# Patient Record
Sex: Female | Born: 1985 | Race: Black or African American | Hispanic: No | Smoking: Never smoker
Health system: Southern US, Community
[De-identification: ages and names within clinical notes are randomized; demographics above are authoritative.]

## PROBLEM LIST (undated history)

## (undated) ENCOUNTER — Emergency Department (HOSPITAL_COMMUNITY): Disposition: A | Discharge status: Home / Self Care | Payer: 59

## (undated) DIAGNOSIS — R0602 Shortness of breath: Secondary | ICD-10-CM

## (undated) DIAGNOSIS — I517 Cardiomegaly: Secondary | ICD-10-CM

## (undated) HISTORY — PX: NO PAST SURGERIES: SHX2092

---

## 2006-09-10 ENCOUNTER — Inpatient Hospital Stay (HOSPITAL_COMMUNITY): Admission: AD | Admit: 2006-09-10 | Discharge: 2006-09-10 | Payer: Self-pay | Admitting: Obstetrics & Gynecology

## 2006-09-12 ENCOUNTER — Inpatient Hospital Stay (HOSPITAL_COMMUNITY): Admission: AD | Admit: 2006-09-12 | Discharge: 2006-09-12 | Payer: Self-pay | Admitting: Obstetrics and Gynecology

## 2006-10-20 ENCOUNTER — Inpatient Hospital Stay (HOSPITAL_COMMUNITY): Admission: AD | Admit: 2006-10-20 | Discharge: 2006-10-20 | Payer: Self-pay | Admitting: Obstetrics & Gynecology

## 2006-11-10 ENCOUNTER — Inpatient Hospital Stay (HOSPITAL_COMMUNITY): Admission: AD | Admit: 2006-11-10 | Discharge: 2006-11-10 | Payer: Self-pay | Admitting: Gynecology

## 2006-11-10 ENCOUNTER — Inpatient Hospital Stay (HOSPITAL_COMMUNITY): Admission: AD | Admit: 2006-11-10 | Discharge: 2006-11-10 | Payer: Self-pay | Admitting: Obstetrics & Gynecology

## 2006-11-27 ENCOUNTER — Ambulatory Visit (HOSPITAL_COMMUNITY): Admission: RE | Admit: 2006-11-27 | Discharge: 2006-11-27 | Payer: Self-pay | Admitting: Obstetrics

## 2006-12-10 ENCOUNTER — Inpatient Hospital Stay (HOSPITAL_COMMUNITY): Admission: AD | Admit: 2006-12-10 | Discharge: 2006-12-10 | Payer: Self-pay | Admitting: Obstetrics & Gynecology

## 2007-02-06 ENCOUNTER — Inpatient Hospital Stay (HOSPITAL_COMMUNITY): Admission: AD | Admit: 2007-02-06 | Discharge: 2007-02-06 | Payer: Self-pay | Admitting: Obstetrics & Gynecology

## 2007-02-22 ENCOUNTER — Inpatient Hospital Stay (HOSPITAL_COMMUNITY): Admission: AD | Admit: 2007-02-22 | Discharge: 2007-02-22 | Payer: Self-pay | Admitting: Obstetrics & Gynecology

## 2007-03-23 ENCOUNTER — Inpatient Hospital Stay (HOSPITAL_COMMUNITY): Admission: AD | Admit: 2007-03-23 | Discharge: 2007-03-23 | Payer: Self-pay | Admitting: Obstetrics & Gynecology

## 2007-04-18 ENCOUNTER — Inpatient Hospital Stay (HOSPITAL_COMMUNITY): Admission: AD | Admit: 2007-04-18 | Discharge: 2007-04-18 | Payer: Self-pay | Admitting: Obstetrics

## 2007-04-21 ENCOUNTER — Inpatient Hospital Stay (HOSPITAL_COMMUNITY): Admission: AD | Admit: 2007-04-21 | Discharge: 2007-04-22 | Payer: Self-pay | Admitting: Obstetrics & Gynecology

## 2007-04-23 ENCOUNTER — Inpatient Hospital Stay (HOSPITAL_COMMUNITY): Admission: AD | Admit: 2007-04-23 | Discharge: 2007-04-25 | Payer: Self-pay | Admitting: Obstetrics & Gynecology

## 2007-04-23 ENCOUNTER — Encounter: Payer: Self-pay | Admitting: Obstetrics & Gynecology

## 2008-10-18 ENCOUNTER — Emergency Department (HOSPITAL_COMMUNITY): Admission: EM | Admit: 2008-10-18 | Discharge: 2008-10-18 | Payer: Self-pay | Admitting: Emergency Medicine

## 2009-07-23 ENCOUNTER — Inpatient Hospital Stay (HOSPITAL_COMMUNITY): Admission: AD | Admit: 2009-07-23 | Discharge: 2009-07-23 | Payer: Self-pay | Admitting: Obstetrics & Gynecology

## 2009-08-15 ENCOUNTER — Inpatient Hospital Stay (HOSPITAL_COMMUNITY): Admission: AD | Admit: 2009-08-15 | Discharge: 2009-08-15 | Payer: Self-pay | Admitting: Obstetrics & Gynecology

## 2009-11-11 ENCOUNTER — Inpatient Hospital Stay (HOSPITAL_COMMUNITY): Admission: AD | Admit: 2009-11-11 | Discharge: 2009-11-11 | Payer: Self-pay | Admitting: Obstetrics and Gynecology

## 2010-01-05 ENCOUNTER — Inpatient Hospital Stay (HOSPITAL_COMMUNITY): Admission: AD | Admit: 2010-01-05 | Discharge: 2010-01-05 | Payer: Self-pay | Admitting: Obstetrics and Gynecology

## 2010-02-17 ENCOUNTER — Inpatient Hospital Stay (HOSPITAL_COMMUNITY): Admission: AD | Admit: 2010-02-17 | Discharge: 2010-02-17 | Payer: Self-pay | Admitting: Obstetrics and Gynecology

## 2010-02-17 ENCOUNTER — Ambulatory Visit: Payer: Self-pay | Admitting: Obstetrics and Gynecology

## 2010-02-21 ENCOUNTER — Inpatient Hospital Stay (HOSPITAL_COMMUNITY): Admission: AD | Admit: 2010-02-21 | Discharge: 2010-02-21 | Payer: Self-pay | Admitting: Obstetrics and Gynecology

## 2010-03-04 ENCOUNTER — Observation Stay (HOSPITAL_COMMUNITY): Admission: AD | Admit: 2010-03-04 | Discharge: 2010-03-05 | Payer: Self-pay | Admitting: Obstetrics and Gynecology

## 2010-03-09 ENCOUNTER — Inpatient Hospital Stay (HOSPITAL_COMMUNITY): Admission: AD | Admit: 2010-03-09 | Discharge: 2010-03-10 | Payer: Self-pay | Admitting: Obstetrics and Gynecology

## 2010-03-11 ENCOUNTER — Inpatient Hospital Stay (HOSPITAL_COMMUNITY): Admission: AD | Admit: 2010-03-11 | Discharge: 2010-03-13 | Payer: Self-pay | Admitting: Obstetrics and Gynecology

## 2010-05-10 ENCOUNTER — Emergency Department (HOSPITAL_COMMUNITY): Admission: EM | Admit: 2010-05-10 | Discharge: 2010-05-11 | Payer: Self-pay | Admitting: Emergency Medicine

## 2010-07-21 ENCOUNTER — Inpatient Hospital Stay (HOSPITAL_COMMUNITY): Admission: AD | Admit: 2010-07-21 | Discharge: 2010-07-21 | Payer: Self-pay | Admitting: Obstetrics and Gynecology

## 2010-07-21 ENCOUNTER — Ambulatory Visit: Payer: Self-pay | Admitting: Gynecology

## 2010-08-11 ENCOUNTER — Inpatient Hospital Stay (HOSPITAL_COMMUNITY): Admission: AD | Admit: 2010-08-11 | Discharge: 2010-08-11 | Payer: Self-pay | Admitting: Obstetrics and Gynecology

## 2010-08-14 ENCOUNTER — Inpatient Hospital Stay (HOSPITAL_COMMUNITY): Admission: AD | Admit: 2010-08-14 | Discharge: 2010-08-14 | Payer: Self-pay | Admitting: Obstetrics and Gynecology

## 2010-08-14 DIAGNOSIS — O0091 Unspecified ectopic pregnancy with intrauterine pregnancy: Secondary | ICD-10-CM

## 2010-09-15 ENCOUNTER — Inpatient Hospital Stay (HOSPITAL_COMMUNITY): Admission: AD | Admit: 2010-09-15 | Discharge: 2010-09-15 | Payer: Self-pay | Admitting: Obstetrics and Gynecology

## 2010-09-15 ENCOUNTER — Ambulatory Visit: Payer: Self-pay | Admitting: Obstetrics and Gynecology

## 2010-11-22 ENCOUNTER — Emergency Department (HOSPITAL_COMMUNITY)
Admission: EM | Admit: 2010-11-22 | Discharge: 2010-11-22 | Payer: Self-pay | Source: Home / Self Care | Admitting: Family Medicine

## 2010-11-24 ENCOUNTER — Emergency Department (HOSPITAL_COMMUNITY)
Admission: EM | Admit: 2010-11-24 | Discharge: 2010-11-24 | Payer: Self-pay | Source: Home / Self Care | Admitting: Emergency Medicine

## 2011-02-02 LAB — CBC
HCT: 37.4 % (ref 36.0–46.0)
MCH: 31.2 pg (ref 26.0–34.0)
MCV: 91.5 fL (ref 78.0–100.0)
Platelets: 289 10*3/uL (ref 150–400)
RBC: 4.09 MIL/uL (ref 3.87–5.11)

## 2011-02-02 LAB — URINE CULTURE: Culture  Setup Time: 201110270459

## 2011-02-02 LAB — URINALYSIS, ROUTINE W REFLEX MICROSCOPIC
Hgb urine dipstick: NEGATIVE
Ketones, ur: 15 mg/dL — AB
Protein, ur: 30 mg/dL — AB
Urobilinogen, UA: 1 mg/dL (ref 0.0–1.0)

## 2011-02-02 LAB — URINE MICROSCOPIC-ADD ON

## 2011-02-03 LAB — CBC
MCH: 30.7 pg (ref 26.0–34.0)
MCHC: 34 g/dL (ref 30.0–36.0)
MCV: 90.2 fL (ref 78.0–100.0)
Platelets: 303 10*3/uL (ref 150–400)
RBC: 4.4 MIL/uL (ref 3.87–5.11)
RDW: 14 % (ref 11.5–15.5)

## 2011-02-03 LAB — DIFFERENTIAL
Basophils Relative: 1 % (ref 0–1)
Eosinophils Absolute: 0.2 10*3/uL (ref 0.0–0.7)
Eosinophils Relative: 3 % (ref 0–5)
Lymphs Abs: 2.2 10*3/uL (ref 0.7–4.0)
Monocytes Absolute: 0.3 10*3/uL (ref 0.1–1.0)
Neutro Abs: 3.9 10*3/uL (ref 1.7–7.7)
Neutrophils Relative %: 58 % (ref 43–77)

## 2011-02-03 LAB — CREATININE, SERUM: GFR calc Af Amer: >60 mL/min (ref >60)

## 2011-02-03 LAB — AST: AST: 14 U/L (ref 0–37)

## 2011-02-04 LAB — HCG, QUANTITATIVE, PREGNANCY: hCG, Beta Chain, Quant, S: 1338 m[IU]/mL — ABNORMAL HIGH (ref <5)

## 2011-02-04 LAB — CBC
MCH: 30.7 pg (ref 26.0–34.0)
MCV: 91.6 fL (ref 78.0–100.0)
Platelets: 281 10*3/uL (ref 150–400)
RBC: 4.45 MIL/uL (ref 3.87–5.11)
RDW: 14 % (ref 11.5–15.5)
WBC: 7.7 10*3/uL (ref 4.0–10.5)

## 2011-02-04 LAB — URINE MICROSCOPIC-ADD ON

## 2011-02-04 LAB — URINALYSIS, ROUTINE W REFLEX MICROSCOPIC
Bilirubin Urine: NEGATIVE
Glucose, UA: NEGATIVE mg/dL
Ketones, ur: NEGATIVE mg/dL
Nitrite: NEGATIVE
Specific Gravity, Urine: 1.025 (ref 1.005–1.030)
pH: 6.5 (ref 5.0–8.0)

## 2011-02-08 LAB — CBC
HCT: 34.9 % — ABNORMAL LOW (ref 36.0–46.0)
HCT: 36 % (ref 36.0–46.0)
Hemoglobin: 12.4 g/dL (ref 12.0–15.0)
MCHC: 34.2 g/dL (ref 30.0–36.0)
MCHC: 34.3 g/dL (ref 30.0–36.0)
MCV: 91.9 fL (ref 78.0–100.0)
MCV: 91.9 fL (ref 78.0–100.0)
MCV: 92.6 fL (ref 78.0–100.0)
Platelets: 179 10*3/uL (ref 150–400)
RBC: 3.8 MIL/uL — ABNORMAL LOW (ref 3.87–5.11)
RDW: 14.2 % (ref 11.5–15.5)
WBC: 9.8 10*3/uL (ref 4.0–10.5)

## 2011-02-09 LAB — URINALYSIS, ROUTINE W REFLEX MICROSCOPIC
Glucose, UA: NEGATIVE mg/dL
Ketones, ur: NEGATIVE mg/dL
Nitrite: NEGATIVE
Protein, ur: NEGATIVE mg/dL

## 2011-02-09 LAB — URINE CULTURE

## 2011-02-13 LAB — URINALYSIS, ROUTINE W REFLEX MICROSCOPIC
Bilirubin Urine: NEGATIVE
Hgb urine dipstick: NEGATIVE
Ketones, ur: 40 mg/dL — AB
Protein, ur: NEGATIVE mg/dL
Urobilinogen, UA: 2 mg/dL — ABNORMAL HIGH (ref 0.0–1.0)

## 2011-02-13 LAB — URINE CULTURE

## 2011-02-13 LAB — URINE MICROSCOPIC-ADD ON

## 2011-02-21 LAB — URINE CULTURE

## 2011-02-21 LAB — URINALYSIS, ROUTINE W REFLEX MICROSCOPIC
Ketones, ur: NEGATIVE mg/dL
Nitrite: NEGATIVE
Protein, ur: NEGATIVE mg/dL
Urobilinogen, UA: 1 mg/dL (ref 0.0–1.0)

## 2011-02-25 LAB — CBC
HCT: 37.9 % (ref 36.0–46.0)
Hemoglobin: 12.2 g/dL (ref 12.0–15.0)
Hemoglobin: 12.7 g/dL (ref 12.0–15.0)
MCHC: 33.9 g/dL (ref 30.0–36.0)
MCV: 91.6 fL (ref 78.0–100.0)
RBC: 3.95 MIL/uL (ref 3.87–5.11)
RBC: 4.11 MIL/uL (ref 3.87–5.11)
RDW: 12.9 % (ref 11.5–15.5)
WBC: 8.8 10*3/uL (ref 4.0–10.5)

## 2011-02-25 LAB — URINALYSIS, ROUTINE W REFLEX MICROSCOPIC
Bilirubin Urine: NEGATIVE
Hgb urine dipstick: NEGATIVE
Ketones, ur: 15 mg/dL — AB
Nitrite: NEGATIVE
Urobilinogen, UA: 2 mg/dL — ABNORMAL HIGH (ref 0.0–1.0)

## 2011-02-25 LAB — URINE CULTURE: Colony Count: 10000

## 2011-02-25 LAB — URINE MICROSCOPIC-ADD ON

## 2011-02-25 LAB — GC/CHLAMYDIA PROBE AMP, GENITAL
Chlamydia, DNA Probe: NEGATIVE
GC Probe Amp, Genital: NEGATIVE

## 2011-02-25 LAB — WET PREP, GENITAL

## 2011-04-05 NOTE — H&P (Signed)
Alexandra Reilly, Alexandra Reilly NO.:  [id number]   MEDICAL RECORD NO.:  [id number]          PATIENT TYPE:  INP   LOCATION:  9165                          FACILITY:  [facility]   PHYSICIAN:  Roseanna Rainbow, M.D.DATE OF BIRTH:  30-Oct-1986   DATE OF ADMISSION:  04/23/2007  DATE OF DISCHARGE:                              HISTORY & PHYSICAL   CHIEF COMPLAINT:  The patient is a 25 year old with an estimated date of  confinement of April 22, 2007, with an intrauterine pregnancy at 40+ weeks  complaining of contractions.   HISTORY OF PRESENT ILLNESS:  Please see the above.   ALLERGIES:  AMOXICILLIN.   MEDICATIONS:  Please see the reconciliation form.   RISK FACTORS:  None.   PRENATAL SCREEN:  Quad screen negative.  Hemoglobin 12, hematocrit 38.4,  platelets 304,000.  Chlamydia probe negative.  GC probe negative.  One-  hour GTT 66.  HIV nonreactive.  Hepatitis B surface antigen negative.  Blood type is A positive.  Antibody screen negative.  RPR nonreactive.  Rubella immune.  Sickle cell negative.  Pap smear LSIL.   PAST GYN HISTORY:  Noncontributory.   PAST MEDICAL HISTORY:  No significant history of medical diseases.   PAST SURGICAL HISTORY:  No previous surgery.   SOCIAL HISTORY:  She is a Advice worker.  She denies any tobacco,  ethanol, or drug use.  She is single.   FAMILY HISTORY:  Hypertension.   PHYSICAL EXAMINATION:  VITAL SIGNS:  Stable, afebrile.  Fetal heart  tracing reassuring.  ABDOMEN:  Tocodynamometer reading contractions every 5 minutes.  PELVIC:  Total vaginal exam per the RN.  The cervix is 4 cm dilated.   ASSESSMENT:  Primigravida with intrauterine pregnancy at term.  Late  dating versus early active labor.  Fetal heart tracing consistent with  fetal well being.   PLAN:  1. Admission.  2. Expectant management.      Roseanna Rainbow, M.D.  Electronically Signed     LAJ/MEDQ  D:  04/23/2007  T:  04/23/2007  Job:  [id number]

## 2011-08-02 ENCOUNTER — Emergency Department (HOSPITAL_COMMUNITY)
Admission: EM | Admit: 2011-08-02 | Discharge: 2011-08-03 | Disposition: A | Discharge status: Home / Self Care | Payer: 59 | Attending: Emergency Medicine | Admitting: Emergency Medicine

## 2011-08-02 DIAGNOSIS — R110 Nausea: Secondary | ICD-10-CM | POA: Insufficient documentation

## 2011-08-02 DIAGNOSIS — R071 Chest pain on breathing: Secondary | ICD-10-CM | POA: Insufficient documentation

## 2011-08-02 DIAGNOSIS — R000 Tachycardia, unspecified: Secondary | ICD-10-CM | POA: Insufficient documentation

## 2011-08-02 DIAGNOSIS — R0989 Other specified symptoms and signs involving the circulatory and respiratory systems: Secondary | ICD-10-CM | POA: Insufficient documentation

## 2011-08-02 DIAGNOSIS — R0609 Other forms of dyspnea: Secondary | ICD-10-CM | POA: Insufficient documentation

## 2011-08-03 ENCOUNTER — Emergency Department (HOSPITAL_COMMUNITY): Payer: 59

## 2011-08-03 LAB — POCT I-STAT TROPONIN I: Troponin i, poc: 0 ng/mL (ref 0.00–0.08)

## 2011-08-03 LAB — CBC
HCT: 39.3 % (ref 36.0–46.0)
Platelets: 288 10*3/uL (ref 150–400)
RDW: 12.7 % (ref 11.5–15.5)
WBC: 9.6 10*3/uL (ref 4.0–10.5)

## 2011-08-03 LAB — POCT I-STAT, CHEM 8
BUN: 9 mg/dL (ref 6–23)
Chloride: 106 mEq/L (ref 96–112)
Potassium: 4 mEq/L (ref 3.5–5.1)
Sodium: 139 mEq/L (ref 135–145)
TCO2: 23 mmol/L (ref 0–100)

## 2011-08-03 LAB — DIFFERENTIAL
Basophils Absolute: 0 10*3/uL (ref 0.0–0.1)
Eosinophils Relative: 3 % (ref 0–5)
Lymphocytes Relative: 25 % (ref 12–46)

## 2011-08-26 IMAGING — US US OB COMP LESS 14 WK
1 series · 14 of 17 positions shown · non-contrast
Comparison: None with this pregnancy.

CLINICAL DATA: 11 weeks pregnant.  Cramping.  Spotting.

OBSTETRIC <14 WK ULTRASOUND
TECHNIQUE: Transabdominal ultrasound was performed for evaluation
of the gestation as well as the maternal uterus and adnexal
regions.

[Series 1: us ob comp less 14 wks · 0.27mm/px · 17 acquisitions, 14 frames shown]
[im 1/17]
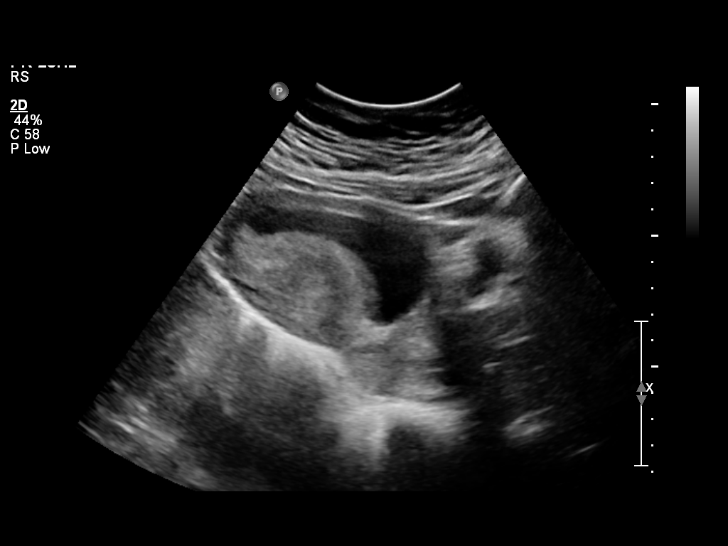
[im 2/17]
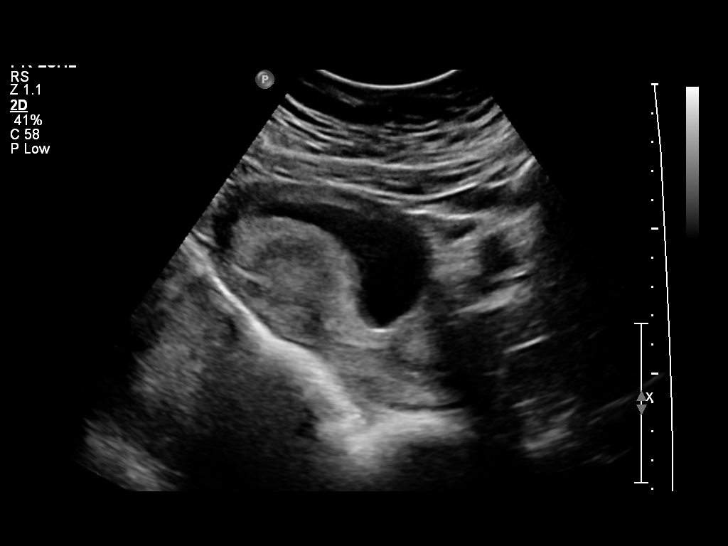
[im 4/17]
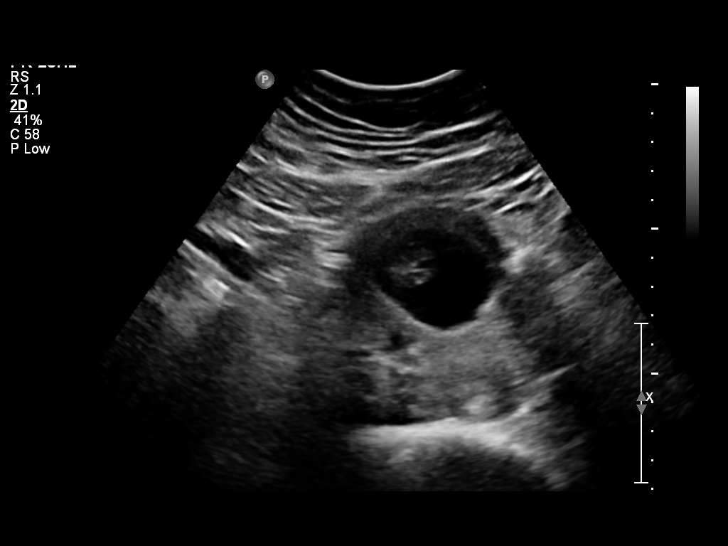
[im 5/17]
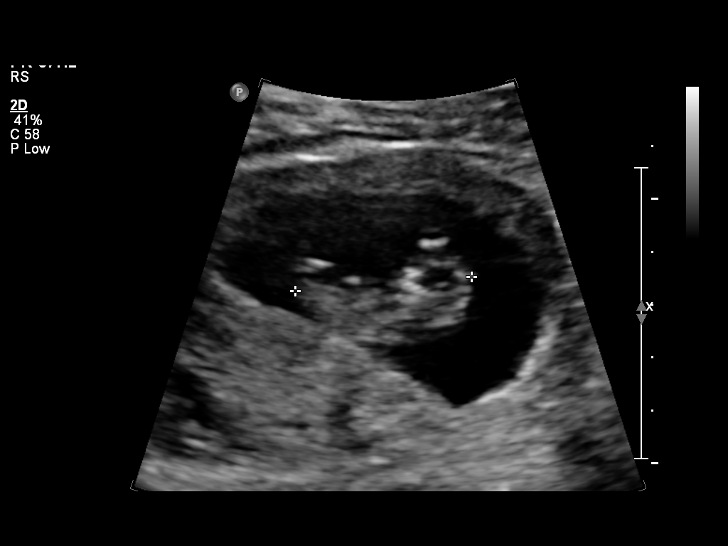
[im 6/17]
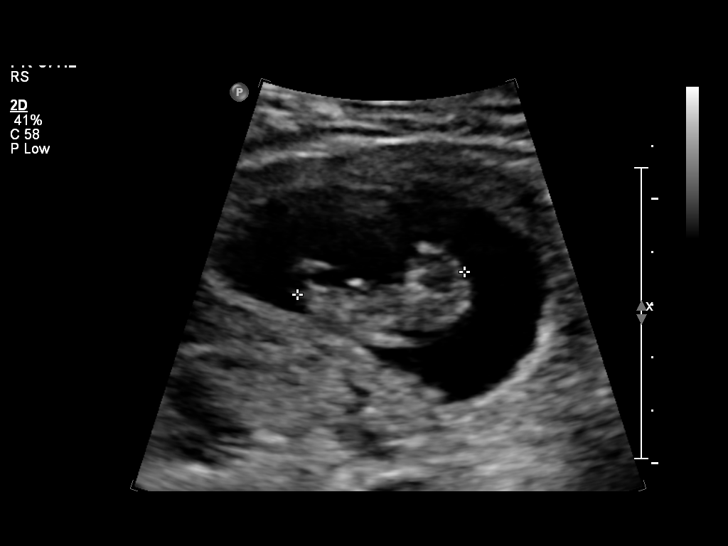
[im 7/17]
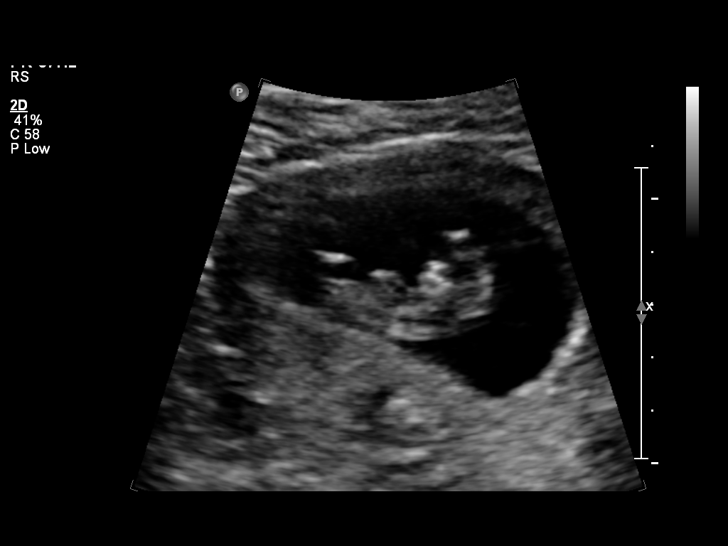
[im 8/17]
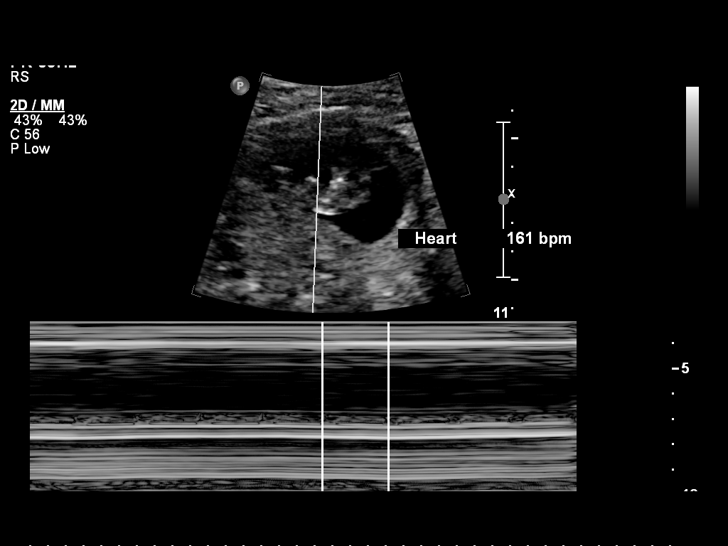
[im 10/17]
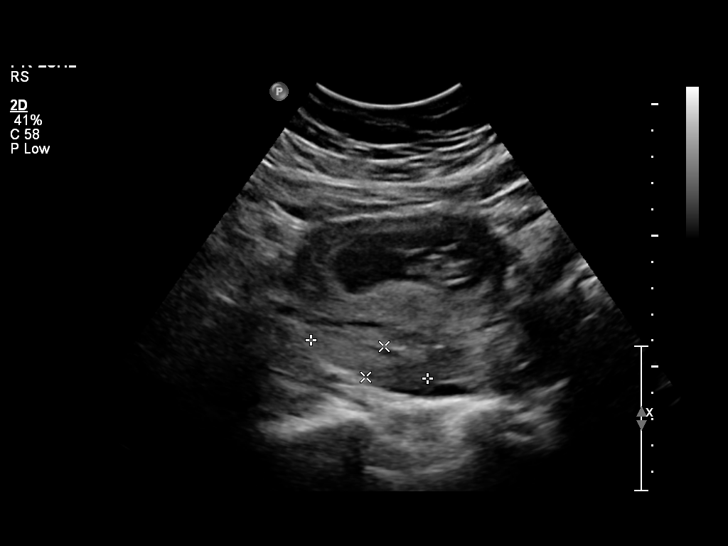
[im 11/17]
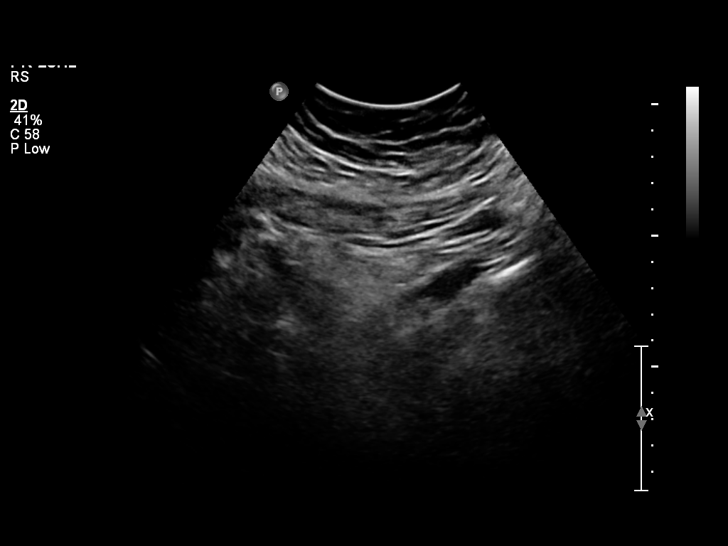
[im 12/17]
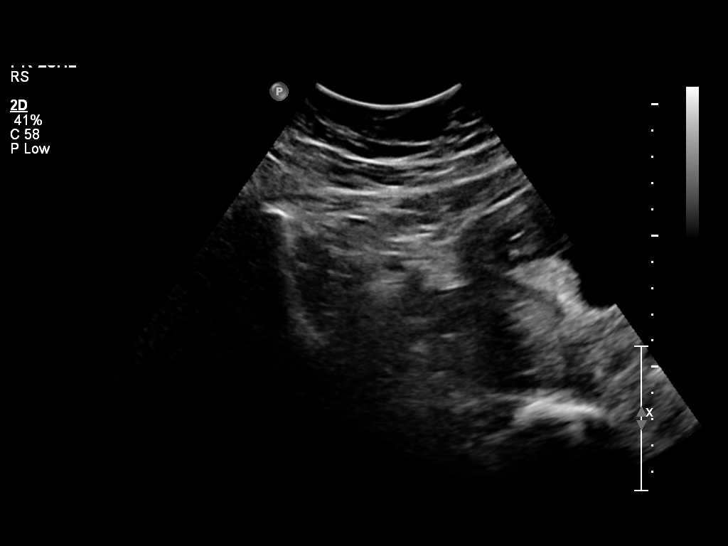
[im 13/17]
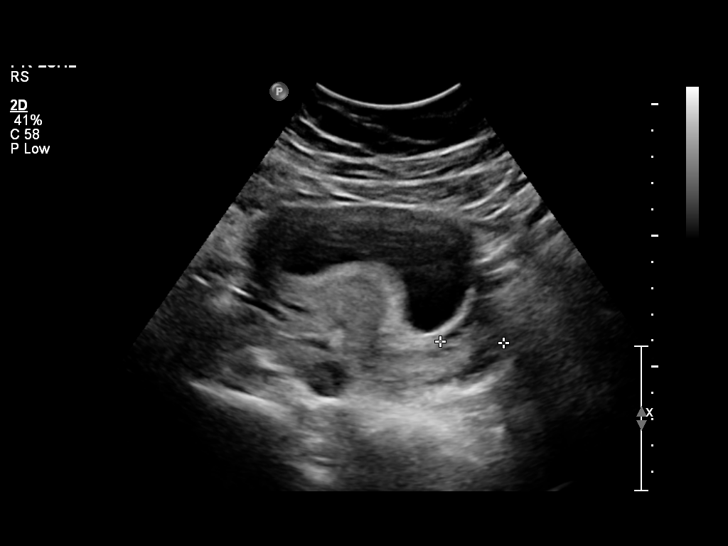
[im 14/17]
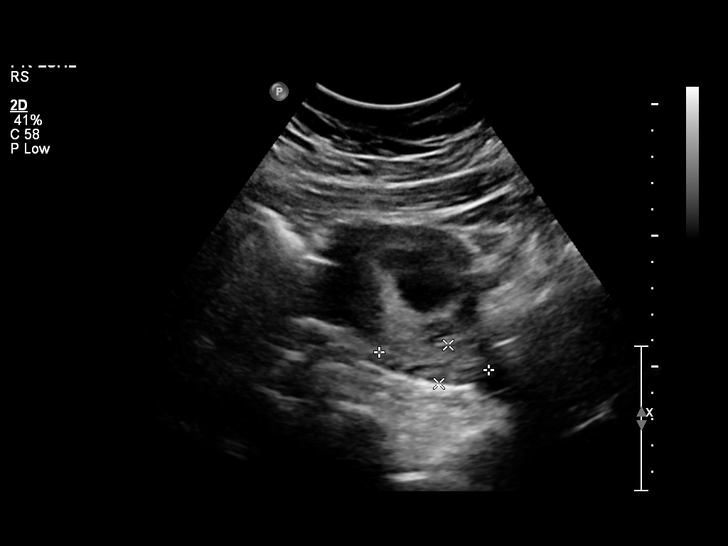
[im 16/17]
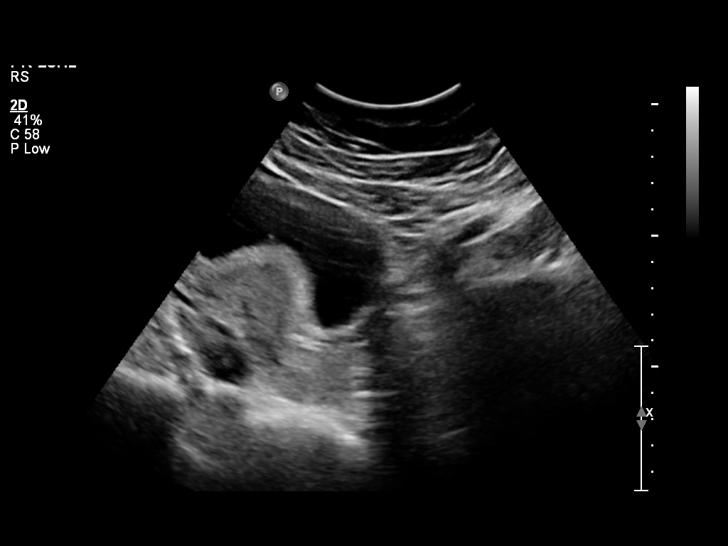
[im 17/17]
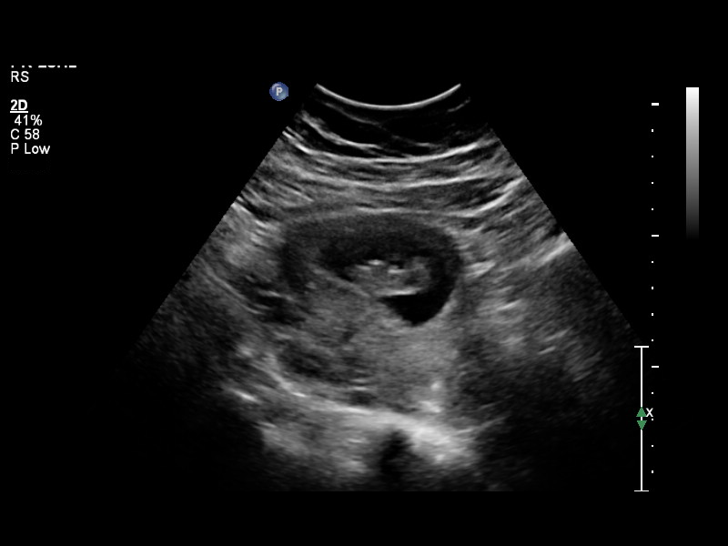

[14 of 17 positions shown; findings below may reference images not displayed]

Intrauterine gestational sac: Single
Yolk sac: Present
Embryo: Present
Cardiac Activity: Present
Heart Rate: 161 bpm

CRL:  3.3         10w  2d           US EDC: 03/11/2010

Maternal uterus/Adnexae:
There is no evidence for subchorionic hemorrhage.  The ovaries are
within normal limits bilaterally.  No significant free fluid is
evident.
IMPRESSION: 1.  Single intrauterine pregnancy with a crown-rump length of
mm, compatible with an estimated gestational age of 10 weeks and 2
days.  This is discordant by 1 week from the estimated gestational
age from the patient's LMP.
2.  Normal fetal heart rate of 161 beats per minute.
3.  No significant hemorrhage or free fluid.

## 2011-09-08 LAB — CBC
HCT: 29.5 — ABNORMAL LOW
Hemoglobin: 10.1 — ABNORMAL LOW
MCV: 89.8
RBC: 3.25 — ABNORMAL LOW
RBC: 4.12
WBC: 12.2 — ABNORMAL HIGH
WBC: 16.7 — ABNORMAL HIGH

## 2011-09-08 LAB — RPR: RPR Ser Ql: NONREACTIVE

## 2011-09-14 ENCOUNTER — Encounter: Payer: Self-pay | Admitting: *Deleted

## 2011-09-14 ENCOUNTER — Emergency Department (HOSPITAL_BASED_OUTPATIENT_CLINIC_OR_DEPARTMENT_OTHER)
Admission: EM | Admit: 2011-09-14 | Discharge: 2011-09-14 | Disposition: A | Discharge status: Home / Self Care | Payer: 59 | Attending: Emergency Medicine | Admitting: Emergency Medicine

## 2011-09-14 DIAGNOSIS — R1013 Epigastric pain: Secondary | ICD-10-CM | POA: Insufficient documentation

## 2011-09-14 MED ORDER — PANTOPRAZOLE SODIUM 40 MG PO TBEC
DELAYED_RELEASE_TABLET | ORAL | Status: AC
  Administered 2011-09-14: 40 mg via ORAL
  Filled 2011-09-14: qty 1

## 2011-09-14 MED ORDER — PANTOPRAZOLE SODIUM 40 MG PO TBEC
40.0000 mg | DELAYED_RELEASE_TABLET | Freq: Every day | ORAL | Status: DC
  Administered 2011-09-14: 40 mg via ORAL

## 2011-09-14 MED ORDER — PANTOPRAZOLE SODIUM 40 MG PO TBEC: 40.0000 mg | DELAYED_RELEASE_TABLET | Freq: Once | ORAL | Status: DC

## 2011-09-14 MED ORDER — SUCRALFATE 1 G PO TABS
1.0000 g | ORAL_TABLET | Freq: Once | ORAL | Status: AC
  Administered 2011-09-14: 1 g via ORAL
  Filled 2011-09-14: qty 1

## 2011-09-14 NOTE — ED Notes (Signed)
Pt presents to ED via [facility] with upper abdominal pain that she states woke her from sleep.  Pt initally rated 5/10 on scale and now states "its much better"  Pt resting comfortable in bed and continues to talk on cell phone while RN is at bedside.

## 2011-09-14 NOTE — ED Provider Notes (Signed)
History     CSN: [id number] Arrival date & time: 09/14/2011  3:37 AM   First MD Initiated Contact with Patient 09/14/11 [phone]      Chief Complaint  Patient presents with  . Abdominal Pain    (Consider location/radiation/quality/duration/timing/severity/associated sxs/prior treatment) HPI This is a 25 year old black female who was awakened from sleep just prior to arrival with severe sharp pain in the epigastrium. It was accompanied by nausea and lightheadedness. She did not vomit. She did not have diarrhea. She called EMS. She described as initially 10 out of 10 but it was 5/10 on EMS arrival. On evaluation by myself she states the pain has gone. She denies any suprapubic pain vaginal bleeding, vaginal discharge or dysuria.   History reviewed. No pertinent past medical history.  History reviewed. No pertinent past surgical history.  History reviewed. No pertinent family history.  History  Substance Use Topics  . Smoking status: Never Smoker   . Smokeless tobacco: Not on file  . Alcohol Use: No    OB History    Grav Para Term Preterm Abortions TAB SAB Ect Mult Living                  Review of Systems  All other systems reviewed and are negative.    Allergies  Amoxicillin  Home Medications  No current outpatient prescriptions on file.  BP 119/69  Pulse 75  Temp(Src) 97.5 F (36.4 C) (Oral)  Resp 18  Wt 220 lb (99.791 kg)  SpO2 98%  LMP 08/31/2011  Physical Exam General: Well-developed, well-nourished female in no acute distress; appearance consistent with age of record HENT: normocephalic, atraumatic Eyes: pupils equal round and reactive to light; extraocular muscles intact Neck: supple Heart: regular rate and rhythm; no murmurs, rubs or gallops Lungs: clear to auscultation bilaterally Abdomen: soft; slight epigastric tenderness; nondistended; no masses or hepatosplenomegaly; bowel sounds present; negative Murphy's sign Extremities: No deformity; full  range of motion Neurologic: Awake, alert and oriented;motor function intact in all extremities and symmetric; no facial droop Skin: Warm and dry Psychiatric: Normal mood and affect    ED Course  Procedures (including critical care time)    MDM  4:25 AM Given Protonix and Carafate in ED. Symptoms have resolved.        Hanley Seamen, MD 09/14/11 [phone]

## 2011-09-14 NOTE — ED Notes (Signed)
Pt OOB ambulating in hall with no difficulties.

## 2011-09-14 NOTE — ED Notes (Signed)
Per EMS: pt reports upper abdominal pain since being woken up from her sleep tonight. 10/10 initially. Now rates 5/10. Nausea at first, but none now. Pt has Merena, and has hx of tubal pregnancies. Denies lower abdominal pains or vaginal bleeding. Vitals stable.

## 2012-02-08 ENCOUNTER — Encounter (HOSPITAL_COMMUNITY): Payer: Self-pay

## 2012-02-08 ENCOUNTER — Inpatient Hospital Stay (HOSPITAL_COMMUNITY)
Admission: AD | Admit: 2012-02-08 | Discharge: 2012-02-08 | Disposition: A | Discharge status: Home / Self Care | Payer: 59 | Source: Ambulatory Visit | Attending: Obstetrics and Gynecology | Admitting: Obstetrics and Gynecology

## 2012-02-08 DIAGNOSIS — R109 Unspecified abdominal pain: Secondary | ICD-10-CM | POA: Insufficient documentation

## 2012-02-08 DIAGNOSIS — K219 Gastro-esophageal reflux disease without esophagitis: Secondary | ICD-10-CM | POA: Insufficient documentation

## 2012-02-08 DIAGNOSIS — R197 Diarrhea, unspecified: Secondary | ICD-10-CM | POA: Insufficient documentation

## 2012-02-08 HISTORY — DX: Cardiomegaly: I51.7

## 2012-02-08 LAB — URINALYSIS, ROUTINE W REFLEX MICROSCOPIC
Bilirubin Urine: NEGATIVE
Hgb urine dipstick: NEGATIVE
Specific Gravity, Urine: 1.01 (ref 1.005–1.030)
pH: 7 (ref 5.0–8.0)

## 2012-02-08 LAB — POCT PREGNANCY, URINE: Preg Test, Ur: NEGATIVE

## 2012-02-08 MED ORDER — GI COCKTAIL ~~LOC~~
30.0000 mL | Freq: Once | ORAL | Status: AC
  Administered 2012-02-08: 30 mL via ORAL
  Filled 2012-02-08: qty 30

## 2012-02-08 MED ORDER — OMEPRAZOLE 40 MG PO CPDR: 40.0000 mg | DELAYED_RELEASE_CAPSULE | Freq: Every day | ORAL | Status: DC

## 2012-02-08 NOTE — MAU Note (Signed)
Pt having upper abd pain and  light headed today.  Pt had diarrhea x 3 today.  Uses Mirena IUD for birth control.  Pt G3 P2.

## 2012-02-08 NOTE — Discharge Instructions (Signed)
Diet for Gastroesophageal Reflux Disease, Child Some children have small, brief episodes of reflux. Reflux (acid reflux) is when acid from your stomach flows up into the esophagus. When acid comes in contact with the esophagus, the acid causes irritation and soreness (inflammation) in the esophagus. The reflux may be so small that a child may not notice it. When reflux happens often or so severely that it causes damage to the esophagus, it is called gastroesophageal reflux disease (GERD). Nutrition therapy can help ease the discomfort of GERD.  FOODS TO AVOID   Caffeinated and decaffeinated coffee and black tea.   Regular or low-calorie carbonated beverages or energy drinks (caffeine-free carbonated beverages are allowed).   Strong spices, such as black pepper, white pepper, red pepper, cayenne, curry powder, and chili powder.   Peppermint or spearmint.   Chocolate.   High-fat foods, including meats and fried foods. Extra added fats including oils, butter, salad dressings, and nuts. Low-fat foods may not be recommended for children less than 2 years of age. Discuss this with your doctor or dietitian.   Fruits and vegetables that are not tolerated, such as citrus fruitsand tomatoes.   Any food that seems to aggravate the child's condition.  If you have questions regarding your child's diet, call your caregiver or a registered dietician. OTHER THINGS THAT MAY HELP GERD INCLUDE:  Having the child eat his or her meals slowly, in a relaxed setting.   Serving several small meals throughout the day instead of 3 large meals.   Eliminating food for a period of time if it causes distress.   Not letting the child lie down immediately after eating a meal.   Keeping the head of the child's bed raised 6 to 9 inches (15 to 23 cm) by using a foam wedge or blocks under the legs of the bed.   Encouraging the child to be physically active. Weight loss may be helpful in reducing reflux in overweight or  obese children.   Having the child wear loose-fitting clothing.   Avoiding the use of tobacco in parents and caregivers. Secondhand smoke may aggravate symptoms in children with reflux.  SAMPLE MEAL PLAN This is a sample meal plan for a 4 to 8 year old child and is approximately 1200 calories based on ChooseMyPlate.gov meal planning guidelines.  Breakfast   cup cooked oatmeal.    cup strawberries.    cup low-fat milk.  Snack   cup cucumber slices.   4 oz yogurt (made from low-fat milk).  Lunch  1 slice whole-wheat bread.   1 oz chicken.    cup blueberries.    cup snap peas.  Snack  3 whole-wheat crackers.   1 oz string cheese.  Dinner   cup brown rice.    cup mixed veggies.   1 cup low-fat milk.   2 oz grilled fish.  Document Released: 03/26/2007 Document Revised: 10/27/2011 Document Reviewed: 09/29/2011 [employer] Patient Information 2012 [employer]. 

## 2012-02-08 NOTE — ED Provider Notes (Signed)
  History     CSN: [id number]  Arrival date and time: 02/08/12 2051   None     Chief Complaint  Patient presents with  . Abdominal Pain  . Diarrhea  . Dizziness   Patient is a 26 y.o. female presenting with abdominal pain and diarrhea.  Abdominal Pain The primary symptoms of the illness include abdominal pain and diarrhea.  Diarrhea The primary symptoms include abdominal pain and diarrhea.   Alexandra Reilly presents today with a CC of epigastric abdominal pain for 3 weeks. She describes the pain as a squeezing sensation and points to her epigastric region. She denies fever, vomiting, or improvement/worsening of pain with food intake but does report a nausea, decrease in appetite, and diarrhea x3 with associated lightheadness. She denies history of reflux, DM, and any abdominal surgical hxt. She describes the pain sometimes getting worse with lying down at night to the point were she is woken up.  She reports feeling this same type of squeezing epigastric pain in October 2012 when she went to the ED which led to the diagnosis of GERD and was given a PPI and Carafate. She denies ever being prescribed these medications and has never tried a PPI. She states she has not seen her primary care physician in over 1 year. She denies any urinary symptoms or vaginal symptoms.    Past Medical History  Diagnosis Date  . Enlarged heart     Past Surgical History  Procedure Date  . No past surgeries     Family History  Problem Relation Age of Onset  . Anesthesia problems Neg Hx     History  Substance Use Topics  . Smoking status: Never Smoker   . Smokeless tobacco: Not on file  . Alcohol Use: No    Allergies:  Allergies  Allergen Reactions  . Amoxicillin Other (See Comments)    Childhood allergy; reaction unknown    Prescriptions prior to admission  Medication Sig Dispense Refill  . ibuprofen (ADVIL,MOTRIN) 200 MG tablet Take 400 mg by mouth every 6 (six) hours as needed. For  headache or pain        Review of Systems  Gastrointestinal: Positive for abdominal pain and diarrhea.  All other systems reviewed and are negative.   Physical Exam   Blood pressure 125/84, pulse 79, temperature 98.2 F (36.8 C), temperature source Oral, resp. rate 18, height 5\' 5"  (1.651 m), weight 238 lb 9.6 oz (108.228 kg).  Physical Exam  Constitutional: She appears well-developed and well-nourished. No distress.       Obese AAF.   HENT:       MMM.   Cardiovascular: Normal rate, regular rhythm, normal heart sounds and intact distal pulses.   No murmur heard. Respiratory: Breath sounds normal. No respiratory distress. She has no wheezes. She has no rales.  GI: Soft. Bowel sounds are normal. She exhibits no distension and no mass. There is no hepatosplenomegaly. There is no rigidity, no rebound, no guarding, no tenderness at McBurney's point and negative Murphy's sign.       Slight epigastric tenderness to deep palpation. Negative psoas and obturator signs.   Skin: Skin is warm and dry. She is not diaphoretic.    MAU Course  Procedures  Assessment and Plan  1. GERD: Patient agreed with plan to start daily PPI and to follow up with primary care physician. Pt also agreed to return if symptoms worsen.   Iverson Alamin 02/08/2012, 11:40 PM

## 2012-02-08 NOTE — MAU Note (Signed)
F. Cres-Dishmon, CNM at bedside discussing poc.  Pt states relief after GI cocktail.

## 2012-02-19 ENCOUNTER — Inpatient Hospital Stay (HOSPITAL_COMMUNITY)
Admission: EM | Admit: 2012-02-19 | Discharge: 2012-02-22 | DRG: 419 | Disposition: A | Discharge status: Home / Self Care | Payer: 59 | Attending: Surgery | Admitting: Surgery

## 2012-02-19 ENCOUNTER — Other Ambulatory Visit: Payer: Self-pay

## 2012-02-19 ENCOUNTER — Emergency Department (HOSPITAL_COMMUNITY): Payer: 59

## 2012-02-19 ENCOUNTER — Encounter (HOSPITAL_COMMUNITY): Payer: Self-pay | Admitting: *Deleted

## 2012-02-19 DIAGNOSIS — R109 Unspecified abdominal pain: Secondary | ICD-10-CM

## 2012-02-19 DIAGNOSIS — K810 Acute cholecystitis: Secondary | ICD-10-CM

## 2012-02-19 DIAGNOSIS — K8020 Calculus of gallbladder without cholecystitis without obstruction: Secondary | ICD-10-CM

## 2012-02-19 DIAGNOSIS — K8067 Calculus of gallbladder and bile duct with acute and chronic cholecystitis with obstruction: Principal | ICD-10-CM | POA: Diagnosis present

## 2012-02-19 HISTORY — DX: Shortness of breath: R06.02

## 2012-02-19 LAB — URINALYSIS, ROUTINE W REFLEX MICROSCOPIC
Bilirubin Urine: NEGATIVE
Hgb urine dipstick: NEGATIVE
Ketones, ur: NEGATIVE mg/dL
Nitrite: NEGATIVE
Protein, ur: NEGATIVE mg/dL
Specific Gravity, Urine: 1.018 (ref 1.005–1.030)
Urobilinogen, UA: 1 mg/dL (ref 0.0–1.0)

## 2012-02-19 LAB — COMPREHENSIVE METABOLIC PANEL
ALT: 11 U/L (ref 0–35)
CO2: 25 mEq/L (ref 19–32)
Calcium: 9 mg/dL (ref 8.4–10.5)
Chloride: 107 mEq/L (ref 96–112)
GFR calc Af Amer: >90 mL/min (ref >90)
GFR calc non Af Amer: >90 mL/min (ref >90)
Glucose, Bld: 96 mg/dL (ref 70–99)
Sodium: 141 mEq/L (ref 135–145)
Total Bilirubin: 0.5 mg/dL (ref 0.3–1.2)

## 2012-02-19 LAB — CBC
HCT: 41.5 % (ref 36.0–46.0)
MCV: 91.4 fL (ref 78.0–100.0)
Platelets: 300 10*3/uL (ref 150–400)
RBC: 4.54 MIL/uL (ref 3.87–5.11)
WBC: 8.8 10*3/uL (ref 4.0–10.5)

## 2012-02-19 LAB — DIFFERENTIAL
Eosinophils Relative: 3 % (ref 0–5)
Lymphocytes Relative: 25 % (ref 12–46)
Lymphs Abs: 2.2 10*3/uL (ref 0.7–4.0)
Monocytes Absolute: 0.5 10*3/uL (ref 0.1–1.0)
Neutro Abs: 5.7 10*3/uL (ref 1.7–7.7)

## 2012-02-19 LAB — URINE MICROSCOPIC-ADD ON

## 2012-02-19 LAB — LIPASE, BLOOD: Lipase: 29 U/L (ref 11–59)

## 2012-02-19 MED ORDER — HYDROMORPHONE HCL PF 1 MG/ML IJ SOLN
1.0000 mg | Freq: Once | INTRAMUSCULAR | Status: AC
  Administered 2012-02-19: 1 mg via INTRAVENOUS
  Filled 2012-02-19: qty 1

## 2012-02-19 MED ORDER — POTASSIUM CHLORIDE IN NACL 20-0.9 MEQ/L-% IV SOLN
INTRAVENOUS | Status: DC
  Administered 2012-02-19 – 2012-02-20 (×2): via INTRAVENOUS
  Filled 2012-02-19 (×3): qty 1000

## 2012-02-19 MED ORDER — OXYCODONE HCL 5 MG PO TABS: 5.0000 mg | ORAL_TABLET | ORAL | Status: DC | PRN

## 2012-02-19 MED ORDER — HYDROMORPHONE HCL PF 1 MG/ML IJ SOLN
1.0000 mg | INTRAMUSCULAR | Status: DC | PRN
  Administered 2012-02-20: 1 mg via INTRAVENOUS
  Filled 2012-02-19: qty 1

## 2012-02-19 MED ORDER — DOCUSATE SODIUM 100 MG PO CAPS
100.0000 mg | ORAL_CAPSULE | Freq: Two times a day (BID) | ORAL | Status: DC
  Administered 2012-02-19: 100 mg via ORAL
  Filled 2012-02-19: qty 1

## 2012-02-19 MED ORDER — ONDANSETRON HCL 4 MG/2ML IJ SOLN
4.0000 mg | Freq: Once | INTRAMUSCULAR | Status: AC
  Administered 2012-02-19: 4 mg via INTRAVENOUS
  Filled 2012-02-19: qty 2

## 2012-02-19 MED ORDER — HYDROMORPHONE HCL PF 1 MG/ML IJ SOLN
0.5000 mg | Freq: Once | INTRAMUSCULAR | Status: AC
  Administered 2012-02-19: 0.5 mg via INTRAVENOUS
  Filled 2012-02-19: qty 1

## 2012-02-19 MED ORDER — ONDANSETRON HCL 4 MG/2ML IJ SOLN
INTRAMUSCULAR | Status: AC
  Administered 2012-02-19: 4 mg
  Filled 2012-02-19: qty 2

## 2012-02-19 MED ORDER — PANTOPRAZOLE SODIUM 40 MG IV SOLR
40.0000 mg | Freq: Every day | INTRAVENOUS | Status: DC
  Administered 2012-02-19 – 2012-02-21 (×3): 40 mg via INTRAVENOUS
  Filled 2012-02-19 (×4): qty 40

## 2012-02-19 MED ORDER — CIPROFLOXACIN IN D5W 400 MG/200ML IV SOLN
400.0000 mg | Freq: Two times a day (BID) | INTRAVENOUS | Status: DC
  Administered 2012-02-19 – 2012-02-20 (×3): 400 mg via INTRAVENOUS
  Filled 2012-02-19 (×3): qty 200

## 2012-02-19 MED ORDER — ONDANSETRON HCL 4 MG/2ML IJ SOLN
4.0000 mg | Freq: Four times a day (QID) | INTRAMUSCULAR | Status: DC | PRN
  Filled 2012-02-19: qty 2

## 2012-02-19 MED ORDER — SODIUM CHLORIDE 0.9 % IV BOLUS (SEPSIS)
500.0000 mL | Freq: Once | INTRAVENOUS | Status: AC
  Administered 2012-02-19: 500 mL via INTRAVENOUS

## 2012-02-19 NOTE — ED Notes (Signed)
Dr wyatt at bedside 

## 2012-02-19 NOTE — ED Notes (Signed)
zofran given for nausea, pt being transferred to floor

## 2012-02-19 NOTE — ED Notes (Signed)
EMS-LUQ pain since this am.

## 2012-02-19 NOTE — ED Provider Notes (Signed)
History     CSN: [id number]  Arrival date & time 02/19/12  7829   First MD Initiated Contact with Patient 02/19/12 [phone]      Chief Complaint  Patient presents with  . Abdominal Pain    (Consider location/radiation/quality/duration/timing/severity/associated sxs/prior treatment) HPI  Patient presents to emergency department complaining of acute onset epigastric to right upper quadrant pain that began last night before bed. Patient states pain was mild to moderate and she is able to ignore the pain and go to sleep. Patient states however when she woke this morning and went to work where she began to have gradual onset of epigastric pain once again. Patient states that at 7 AM  she had acute increase in severity of the pain. Patient states pain is associated with nausea but denies any vomiting or diarrhea. Patient states the pain is similar to 2 other episodes she's had over the last year.  Patient states that in the first episode she was taken to the [facility] but does not remember any imaging or outcome from that visit. Patient states the second episode was earlier this month and was acute in onset but resolved without any medical attention or intervention. Patient states she has history of mildly enlarged heart but no other known medical problems. She denies history of abdominal surgeries. She takes no medicines on regular basis. She is G3 P2 with one miscarriage. Both deliveries were vaginal. Patient has an IUD and therefore has not had menstruation cycle in a long time. Patient is followed by [facility]. Patient states the pain was acutely worse this morning, persistent, and unchanging. Patient denies any associated fevers, chills, chest pain, shortness of breath, lower abdominal pain, dysuria, hematuria, vaginal discharge, dyspareunia, or blood in her stool. Patient taken nothing for pain this morning. She denies aggravating or alleviating factors. Patient states she  is unaware of pain being associated with any particular time of the day or eating.  Past Medical History  Diagnosis Date  . Enlarged heart     Past Surgical History  Procedure Date  . No past surgeries     Family History  Problem Relation Age of Onset  . Anesthesia problems Neg Hx     History  Substance Use Topics  . Smoking status: Never Smoker   . Smokeless tobacco: Not on file  . Alcohol Use: No    OB History    Grav Para Term Preterm Abortions TAB SAB Ect Mult Living   3 2 2  0 1 0 0 1 0 2      Review of Systems  All other systems reviewed and are negative.    Allergies  Amoxicillin  Home Medications   Current Outpatient Rx  Name Route Sig Dispense Refill  . ALUM & MAG HYDROXIDE-SIMETH 200-200-20 MG/5ML PO SUSP Oral Take 15 mLs by mouth every 6 (six) hours as needed. For stomach acid    . IBUPROFEN 200 MG PO TABS Oral Take 400 mg by mouth every 6 (six) hours as needed. For headache or pain      BP 96/79  Pulse 101  Temp(Src) 98.3 F (36.8 C) (Oral)  SpO2 100%  Physical Exam  Nursing note and vitals reviewed. Constitutional: She is oriented to person, place, and time. She appears well-developed and well-nourished. No distress.       Obese abdomen  HENT:  Head: Normocephalic and atraumatic.  Eyes: Conjunctivae are normal.  Neck: Normal range of motion. Neck supple.  Cardiovascular:  Normal rate, regular rhythm, normal heart sounds and intact distal pulses.  Exam reveals no gallop and no friction rub.   No murmur heard. Pulmonary/Chest: Effort normal and breath sounds normal. No respiratory distress. She has no wheezes. She has no rales. She exhibits no tenderness.  Abdominal: Soft. Bowel sounds are normal. She exhibits no distension and no mass. There is tenderness. There is no rebound and no guarding.       Mild to moderate TTP of epigastrum with mild TTP of RUQ but no rigidity or distention  Musculoskeletal: Normal range of motion. She exhibits no  edema and no tenderness.  Neurological: She is alert and oriented to person, place, and time.  Skin: Skin is warm and dry. No rash noted. She is not diaphoretic. No erythema.  Psychiatric: She has a normal mood and affect.    ED Course  Procedures (including critical care time)  IV fluids, zofran and dilaudid  10:52 AM Case discussed with Dr. Ignacia Palma. US findings discussed with patient and Dr. Ignacia Palma. Patient states pain is improved but "still uncomfortable." Dr. Ignacia Palma to discuss with general surgery.   Labs Reviewed  COMPREHENSIVE METABOLIC PANEL - Abnormal; Notable for the following:    Albumin 3.4 (*)    All other components within normal limits  URINALYSIS, ROUTINE W REFLEX MICROSCOPIC - Abnormal; Notable for the following:    APPearance CLOUDY (*)    Leukocytes, UA SMALL (*)    All other components within normal limits  URINE MICROSCOPIC-ADD ON - Abnormal; Notable for the following:    Squamous Epithelial / LPF FEW (*)    All other components within normal limits  CBC  DIFFERENTIAL  PREGNANCY, URINE  LIPASE, BLOOD   US Abdomen Complete  02/19/2012  *RADIOLOGY REPORT*  Clinical Data:  Recurrent abdominal pain.  Epigastric and right upper quadrant pain.  COMPLETE ABDOMINAL ULTRASOUND  Comparison:  No priors.  Findings:  Gallbladder:  Filled with echogenic shadowing material presumably multiple gallstones. The gallbladder is only moderately distended, however, there is severe gallbladder wall thickening (13 mm).  No pericholecystic fluid identified.  Per report, the patient's sonographic Murphy's sign was equivocal, however, the patient had been given pain medication.  Common bile duct:  7 mm in diameter in the porta hepatis.  The distal common bile duct could not be visualized.  Liver:  No focal lesion identified.  Within normal limits in parenchymal echogenicity.  IVC:  Appears normal.  Pancreas:  The head and body of the pancreas are normal in echotexture and appearance.   The distal tail cannot be visualized secondary to overlying bowel gas.  Spleen:  Normal in echotexture appearance measuring 8.7 cm in length.  Right Kidney:  Normal in echotexture and appearance, without focal cystic or solid lesions, measuring 11.8 cm in length.  No definite stones.  No signs of hydronephrosis.  Left Kidney:  Normal in echotexture appearance measuring 12.8 cm in length.  No focal cystic or solid lesions, and no definite stones or hydronephrosis.  Abdominal aorta:  Measures up to 2.1 cm in diameter proximally, and tapers appropriately distally.  IMPRESSION: 1.The study demonstrates extensive cholelithiasis, with marked gallbladder wall thickening (13 mm).  Findings are suggestive of acute cholecystitis, however, there is no pericholecystic fluid noted, and assessment for sonographic Murphy's sign was limited by administration of pain medication (sonographic Murphy's sign was equivocal). 2.  Common bile duct is mildly dilated (7 mm).  This could suggest the presence of a distal common bile duct stone.  No definite intrahepatic biliary ductal dilatation noted at this time.  Original Report Authenticated By: Florencia Reasons, M.D.   11:05 AM Dr. Lindie Spruce to see patient in ER.   1. Acute cholecystitis       MDM  Dr. Lindie Spruce evaluated and to admit patient. VSS. Afebrile. NAD.         Jenness Corner, [place] 02/19/12 1133

## 2012-02-19 NOTE — ED Provider Notes (Signed)
8:42 AM  Date: 02/19/2012  Rate: 88  Rhythm: normal sinus rhythm  QRS Axis: normal  Intervals: normal  ST/T Wave abnormalities: normal  Conduction Disutrbances:none  Narrative Interpretation: Normal EKG.  Old EKG Reviewed: unchanged    Carleene Cooper III, MD 02/19/12 [phone]

## 2012-02-19 NOTE — H&P (Signed)
Alexandra [name].   Chief Complaint: Abdominal pain about 4 hours duration  HPI: Ultrasound demonstrates acute cholecystitis with cholelithiasis  Past Medical History  Diagnosis Date  . Enlarged heart     Past Surgical History  Procedure Date  . No past surgeries     Family History  Problem Relation Age of Onset  . Anesthesia problems Neg Hx    Social History:  reports that she has never smoked. She does not have any smokeless tobacco history on file. She reports that she does not drink alcohol or use illicit drugs.  Allergies:  Allergies  Allergen Reactions  . Amoxicillin Other (See Comments)    Childhood allergy; reaction unknown    Medications Prior to Admission  Medication Dose Route Frequency Provider Last Rate Last Dose  . ciprofloxacin (CIPRO) IVPB 400 mg  400 mg Intravenous Q12H Cherylynn Ridges, MD      . HYDROmorphone (DILAUDID) injection 0.5 mg  0.5 mg Intravenous Once [place]ness [place], PA   0.5 mg at 02/19/12 1114  . HYDROmorphone (DILAUDID) injection 1 mg  1 mg Intravenous Once [place], PA   1 mg at 02/19/12 0844  . ondansetron (ZOFRAN) injection 4 mg  4 mg Intravenous Once Jenness [place], PA   4 mg at 02/19/12 0844  . sodium chloride 0.9 % bolus 500 mL  500 mL Intravenous Once [employer], PA   500 mL at 02/19/12 0844   Medications Prior to Admission  Medication Sig Dispense Refill  . ibuprofen (ADVIL,MOTRIN) 200 MG tablet Take 400 mg by mouth every 6 (six) hours as needed. For headache or pain        Results for orders placed during the hospital encounter of 02/19/12 (from the past 48 hour(s))  URINALYSIS, ROUTINE W REFLEX MICROSCOPIC     Status: Abnormal   Collection Time   02/19/12  8:12 AM      Component Value Range Comment   Color, Urine YELLOW  YELLOW     APPearance CLOUDY (*) CLEAR     Specific Gravity, Urine 1.018  1.005 - 1.030     pH 7.5  5.0 - 8.0     Glucose, UA NEGATIVE  NEGATIVE (mg/dL)    Hgb urine dipstick  NEGATIVE  NEGATIVE     Bilirubin Urine NEGATIVE  NEGATIVE     Ketones, ur NEGATIVE  NEGATIVE (mg/dL)    Protein, ur NEGATIVE  NEGATIVE (mg/dL)    Urobilinogen, UA 1.0  0.0 - 1.0 (mg/dL)    Nitrite NEGATIVE  NEGATIVE     Leukocytes, UA SMALL (*) NEGATIVE    PREGNANCY, URINE     Status: Normal   Collection Time   02/19/12  8:12 AM      Component Value Range Comment   Preg Test, Ur NEGATIVE  NEGATIVE    URINE MICROSCOPIC-ADD ON     Status: Abnormal   Collection Time   02/19/12  8:12 AM      Component Value Range Comment   Squamous Epithelial / LPF FEW (*) RARE     WBC, UA 0-2  <3 (WBC/hpf)    Bacteria, UA RARE  RARE     Urine-Other MUCOUS PRESENT   AMORPHOUS URATES/PHOSPHATES  CBC     Status: Normal   Collection Time   02/19/12  8:20 AM      Component Value Range Comment   WBC 8.8  4.0 - 10.5 (K/uL)    RBC  4.54  3.87 - 5.11 (MIL/uL)    Hemoglobin 14.0  12.0 - 15.0 (g/dL)    HCT 16.1  09.6 - 04.5 (%)    MCV 91.4  78.0 - 100.0 (fL)    MCH 30.8  26.0 - 34.0 (pg)    MCHC 33.7  30.0 - 36.0 (g/dL)    RDW 40.9  81.1 - 91.4 (%)    Platelets 300  150 - 400 (K/uL)   DIFFERENTIAL     Status: Normal   Collection Time   02/19/12  8:20 AM      Component Value Range Comment   Neutrophils Relative 66  43 - 77 (%)    Neutro Abs 5.7  1.7 - 7.7 (K/uL)    Lymphocytes Relative 25  12 - 46 (%)    Lymphs Abs 2.2  0.7 - 4.0 (K/uL)    Monocytes Relative 6  3 - 12 (%)    Monocytes Absolute 0.5  0.1 - 1.0 (K/uL)    Eosinophils Relative 3  0 - 5 (%)    Eosinophils Absolute 0.3  0.0 - 0.7 (K/uL)    Basophils Relative 0  0 - 1 (%)    Basophils Absolute 0.0  0.0 - 0.1 (K/uL)   COMPREHENSIVE METABOLIC PANEL     Status: Abnormal   Collection Time   02/19/12  8:20 AM      Component Value Range Comment   Sodium 141  135 - 145 (mEq/L)    Potassium 3.8  3.5 - 5.1 (mEq/L)    Chloride 107  96 - 112 (mEq/L)    CO2 25  19 - 32 (mEq/L)    Glucose, Bld 96  70 - 99 (mg/dL)    BUN 6  6 - 23 (mg/dL)     Creatinine, Ser 7.82  0.50 - 1.10 (mg/dL)    Calcium 9.0  8.4 - 10.5 (mg/dL)    Total Protein 6.9  6.0 - 8.3 (g/dL)    Albumin 3.4 (*) 3.5 - 5.2 (g/dL)    AST 19  0 - 37 (U/L)    ALT 11  0 - 35 (U/L)    Alkaline Phosphatase 73  39 - 117 (U/L)    Total Bilirubin 0.5  0.3 - 1.2 (mg/dL)    GFR calc non Af Amer >90  >90 (mL/min)    GFR calc Af Amer >90  >90 (mL/min)   LIPASE, BLOOD     Status: Normal   Collection Time   02/19/12  8:20 AM      Component Value Range Comment   Lipase 29  11 - 59 (U/L)    US Abdomen Complete  02/19/2012  *RADIOLOGY REPORT*  Clinical Data:  Recurrent abdominal pain.  Epigastric and right upper quadrant pain.  COMPLETE ABDOMINAL ULTRASOUND  Comparison:  No priors.  Findings:  Gallbladder:  Filled with echogenic shadowing material presumably multiple gallstones. The gallbladder is only moderately distended, however, there is severe gallbladder wall thickening (13 mm).  No pericholecystic fluid identified.  Per report, the patient's sonographic Murphy's sign was equivocal, however, the patient had been given pain medication.  Common bile duct:  7 mm in diameter in the porta hepatis.  The distal common bile duct could not be visualized.  Liver:  No focal lesion identified.  Within normal limits in parenchymal echogenicity.  IVC:  Appears normal.  Pancreas:  The head and body of the pancreas are normal in echotexture and appearance.  The distal tail cannot be visualized secondary  to overlying bowel gas.  Spleen:  Normal in echotexture appearance measuring 8.7 cm in length.  Right Kidney:  Normal in echotexture and appearance, without focal cystic or solid lesions, measuring 11.8 cm in length.  No definite stones.  No signs of hydronephrosis.  Left Kidney:  Normal in echotexture appearance measuring 12.8 cm in length.  No focal cystic or solid lesions, and no definite stones or hydronephrosis.  Abdominal aorta:  Measures up to 2.1 cm in diameter proximally, and tapers appropriately  distally.  IMPRESSION: 1.The study demonstrates extensive cholelithiasis, with marked gallbladder wall thickening (13 mm).  Findings are suggestive of acute cholecystitis, however, there is no pericholecystic fluid noted, and assessment for sonographic Murphy's sign was limited by administration of pain medication (sonographic Murphy's sign was equivocal). 2.  Common bile duct is mildly dilated (7 mm).  This could suggest the presence of a distal common bile duct stone.  No definite intrahepatic biliary ductal dilatation noted at this time.  Original Report Authenticated By: Florencia Reasons, M.D.    Review of Systems  Constitutional: Negative.   HENT: Negative.   Eyes: Negative.   Respiratory: Negative.   Cardiovascular: Negative.   Gastrointestinal: Positive for abdominal pain (RUQ).  Genitourinary: Negative.   Musculoskeletal: Negative.   Skin: Negative.   Neurological: Negative.   Endo/Heme/Allergies: Negative.   Psychiatric/Behavioral: Negative.     Blood pressure 119/79, pulse 103, temperature 98.3 F (36.8 C), temperature source Oral, SpO2 100.00%. Physical Exam  Constitutional: She is oriented to person, place, and time. She appears well-developed and well-nourished.    HENT:  Head: Normocephalic and atraumatic.  Eyes: Conjunctivae and EOM are normal. Pupils are equal, round, and reactive to light.  Neck: Normal range of motion. Neck supple.  Cardiovascular: Normal rate, regular rhythm and normal heart sounds.   Respiratory: Effort normal and breath sounds normal.  GI: Soft. She exhibits no mass. There is tenderness (RUQ asnd epigastrium). There is guarding. There is no rebound.  Musculoskeletal: Normal range of motion.  Neurological: She is alert and oriented to person, place, and time. She has normal reflexes.  Skin: Skin is warm and dry.  Psychiatric: She has a normal mood and affect. Her behavior is normal. Judgment and thought content normal.      Assessment/Plan Cholelithiasis with acute cholecystitis  Will give 24 hours of IV antibiotics/Ciprofloxacin, then likely for cholecystectomy tomorrow.  Indie Nickerson III,Jaelan Rasheed O 02/19/2012, 11:26 AM

## 2012-02-19 NOTE — ED Notes (Signed)
Attempted report x 1, name and number given to secretary 

## 2012-02-20 ENCOUNTER — Observation Stay (HOSPITAL_COMMUNITY): Payer: 59 | Admitting: Certified Registered"

## 2012-02-20 ENCOUNTER — Observation Stay (HOSPITAL_COMMUNITY): Payer: 59

## 2012-02-20 ENCOUNTER — Encounter (HOSPITAL_COMMUNITY): Admission: EM | Disposition: A | Discharge status: Home / Self Care | Payer: Self-pay | Source: Home / Self Care

## 2012-02-20 ENCOUNTER — Encounter (HOSPITAL_COMMUNITY): Payer: Self-pay | Admitting: Certified Registered"

## 2012-02-20 DIAGNOSIS — K8010 Calculus of gallbladder with chronic cholecystitis without obstruction: Secondary | ICD-10-CM

## 2012-02-20 HISTORY — PX: CHOLECYSTECTOMY: SHX55

## 2012-02-20 LAB — CBC
HCT: 40.2 % (ref 36.0–46.0)
MCV: 92 fL (ref 78.0–100.0)
Platelets: 261 10*3/uL (ref 150–400)
RBC: 4.37 MIL/uL (ref 3.87–5.11)
WBC: 6.2 10*3/uL (ref 4.0–10.5)

## 2012-02-20 LAB — COMPREHENSIVE METABOLIC PANEL
Albumin: 3.2 g/dL — ABNORMAL LOW (ref 3.5–5.2)
BUN: 3 mg/dL — ABNORMAL LOW (ref 6–23)
Creatinine, Ser: 0.59 mg/dL (ref 0.50–1.10)
GFR calc Af Amer: >90 mL/min (ref >90)
Total Bilirubin: 1.1 mg/dL (ref 0.3–1.2)
Total Protein: 6.6 g/dL (ref 6.0–8.3)

## 2012-02-20 SURGERY — LAPAROSCOPIC CHOLECYSTECTOMY WITH INTRAOPERATIVE CHOLANGIOGRAM
Anesthesia: General | Site: Abdomen | Wound class: Clean Contaminated

## 2012-02-20 MED ORDER — KCL IN DEXTROSE-NACL 20-5-0.45 MEQ/L-%-% IV SOLN
INTRAVENOUS | Status: DC
  Administered 2012-02-20 – 2012-02-22 (×5): via INTRAVENOUS
  Filled 2012-02-20 (×7): qty 1000

## 2012-02-20 MED ORDER — ONDANSETRON HCL 4 MG PO TABS: 4.0000 mg | ORAL_TABLET | Freq: Four times a day (QID) | ORAL | Status: DC | PRN

## 2012-02-20 MED ORDER — HEPARIN SODIUM (PORCINE) 5000 UNIT/ML IJ SOLN
5000.0000 [IU] | Freq: Three times a day (TID) | INTRAMUSCULAR | Status: DC
Start: 2012-02-20 — End: 2012-02-21
  Administered 2012-02-20 – 2012-02-21 (×2): 5000 [IU] via SUBCUTANEOUS
  Filled 2012-02-20 (×5): qty 1

## 2012-02-20 MED ORDER — BUPIVACAINE-EPINEPHRINE 0.25% -1:200000 IJ SOLN: INTRAMUSCULAR | Status: DC | PRN

## 2012-02-20 MED ORDER — BUPIVACAINE HCL 0.25 % IJ SOLN
INTRAMUSCULAR | Status: DC | PRN
  Administered 2012-02-20: 30 mL

## 2012-02-20 MED ORDER — GLYCOPYRROLATE 0.2 MG/ML IJ SOLN
INTRAMUSCULAR | Status: DC | PRN
  Administered 2012-02-20: 0.6 mg via INTRAVENOUS

## 2012-02-20 MED ORDER — HEMOSTATIC AGENTS (NO CHARGE) OPTIME
TOPICAL | Status: DC | PRN
  Administered 2012-02-20: 1 via TOPICAL

## 2012-02-20 MED ORDER — LACTATED RINGERS IV SOLN
INTRAVENOUS | Status: DC | PRN
  Administered 2012-02-20 (×2): via INTRAVENOUS

## 2012-02-20 MED ORDER — MIDAZOLAM HCL 5 MG/5ML IJ SOLN
INTRAMUSCULAR | Status: DC | PRN
  Administered 2012-02-20: 1 mg via INTRAVENOUS

## 2012-02-20 MED ORDER — MORPHINE SULFATE 2 MG/ML IJ SOLN
1.0000 mg | INTRAMUSCULAR | Status: DC | PRN
  Administered 2012-02-20: 3 mg via INTRAVENOUS
  Administered 2012-02-20 – 2012-02-21 (×2): 2 mg via INTRAVENOUS
  Administered 2012-02-21 (×2): 3 mg via INTRAVENOUS
  Filled 2012-02-20: qty 1
  Filled 2012-02-20: qty 2
  Filled 2012-02-20: qty 1
  Filled 2012-02-20 (×2): qty 2

## 2012-02-20 MED ORDER — HYDROMORPHONE HCL PF 1 MG/ML IJ SOLN
0.2500 mg | INTRAMUSCULAR | Status: DC | PRN
  Administered 2012-02-20 (×2): 0.5 mg via INTRAVENOUS

## 2012-02-20 MED ORDER — NEOSTIGMINE METHYLSULFATE 1 MG/ML IJ SOLN
INTRAMUSCULAR | Status: DC | PRN
  Administered 2012-02-20: 5 mg via INTRAVENOUS

## 2012-02-20 MED ORDER — ROCURONIUM BROMIDE 100 MG/10ML IV SOLN
INTRAVENOUS | Status: DC | PRN
  Administered 2012-02-20: 15 mg via INTRAVENOUS
  Administered 2012-02-20: 10 mg via INTRAVENOUS
  Administered 2012-02-20: 35 mg via INTRAVENOUS

## 2012-02-20 MED ORDER — HYDROCODONE-ACETAMINOPHEN 5-325 MG PO TABS
1.0000 | ORAL_TABLET | ORAL | Status: DC | PRN
  Administered 2012-02-20 (×2): 1 via ORAL
  Administered 2012-02-21 – 2012-02-22 (×5): 2 via ORAL
  Filled 2012-02-20 (×4): qty 2
  Filled 2012-02-20: qty 1
  Filled 2012-02-20: qty 2
  Filled 2012-02-20: qty 1

## 2012-02-20 MED ORDER — ONDANSETRON HCL 4 MG/2ML IJ SOLN
INTRAMUSCULAR | Status: DC | PRN
  Administered 2012-02-20: 4 mg via INTRAVENOUS

## 2012-02-20 MED ORDER — SODIUM CHLORIDE 0.9 % IR SOLN
Status: DC | PRN
  Administered 2012-02-20: 1000 mL

## 2012-02-20 MED ORDER — GLUCAGON HCL (RDNA) 1 MG IJ SOLR
INTRAMUSCULAR | Status: DC | PRN
  Administered 2012-02-20: 1 mg via INTRAVENOUS

## 2012-02-20 MED ORDER — 0.9 % SODIUM CHLORIDE (POUR BTL) OPTIME
TOPICAL | Status: DC | PRN
  Administered 2012-02-20: 1000 mL

## 2012-02-20 MED ORDER — ONDANSETRON HCL 4 MG/2ML IJ SOLN
4.0000 mg | Freq: Four times a day (QID) | INTRAMUSCULAR | Status: DC | PRN
  Administered 2012-02-22: 4 mg via INTRAVENOUS

## 2012-02-20 MED ORDER — LIDOCAINE HCL (CARDIAC) 20 MG/ML IV SOLN
INTRAVENOUS | Status: DC | PRN
  Administered 2012-02-20: 60 mg via INTRAVENOUS

## 2012-02-20 MED ORDER — SODIUM CHLORIDE 0.9 % IV SOLN
INTRAVENOUS | Status: DC | PRN
  Administered 2012-02-20: 11:00:00

## 2012-02-20 MED ORDER — LACTATED RINGERS IV SOLN
INTRAVENOUS | Status: DC
  Administered 2012-02-20: 09:00:00 via INTRAVENOUS

## 2012-02-20 MED ORDER — ONDANSETRON HCL 4 MG/2ML IJ SOLN: 4.0000 mg | Freq: Once | INTRAMUSCULAR | Status: DC | PRN

## 2012-02-20 MED ORDER — PROPOFOL 10 MG/ML IV EMUL
INTRAVENOUS | Status: DC | PRN
  Administered 2012-02-20: 200 mg via INTRAVENOUS

## 2012-02-20 MED ORDER — FENTANYL CITRATE 0.05 MG/ML IJ SOLN
INTRAMUSCULAR | Status: DC | PRN
  Administered 2012-02-20: 50 ug via INTRAVENOUS
  Administered 2012-02-20: 150 ug via INTRAVENOUS
  Administered 2012-02-20: 25 ug via INTRAVENOUS
  Administered 2012-02-20 (×2): 50 ug via INTRAVENOUS

## 2012-02-20 SURGICAL SUPPLY — 53 items
ADH SKN CLS APL DERMABOND .7 (GAUZE/BANDAGES/DRESSINGS) ×1
APPLIER CLIP ROT 10 11.4 M/L (STAPLE) ×2
APR CLP MED LRG 11.4X10 (STAPLE) ×1
BAG SPEC RTRVL LRG 6X4 10 (ENDOMECHANICALS) ×1
BLADE SURG ROTATE 9660 (MISCELLANEOUS) IMPLANT
CANISTER SUCTION 2500CC (MISCELLANEOUS) ×2 IMPLANT
CHLORAPREP W/TINT 26ML (MISCELLANEOUS) ×2 IMPLANT
CHOLANGIOGRAM CATH TAUT (CATHETERS) ×2 IMPLANT
CLIP APPLIE ROT 10 11.4 M/L (STAPLE) ×1 IMPLANT
CLOTH BEACON ORANGE TIMEOUT ST (SAFETY) ×2 IMPLANT
COVER MAYO STAND STRL (DRAPES) ×2 IMPLANT
COVER SURGICAL LIGHT HANDLE (MISCELLANEOUS) ×2 IMPLANT
DECANTER SPIKE VIAL GLASS SM (MISCELLANEOUS) ×2 IMPLANT
DERMABOND ADVANCED (GAUZE/BANDAGES/DRESSINGS) ×1
DERMABOND ADVANCED .7 DNX12 (GAUZE/BANDAGES/DRESSINGS) ×1 IMPLANT
DRAPE C-ARM 42X72 X-RAY (DRAPES) ×2 IMPLANT
DRAPE UTILITY 15X26 W/TAPE STR (DRAPE) ×4 IMPLANT
ELECT REM PT RETURN 9FT ADLT (ELECTROSURGICAL) ×2
ELECTRODE REM PT RTRN 9FT ADLT (ELECTROSURGICAL) ×1 IMPLANT
FILTER SMOKE EVAC LAPAROSHD (FILTER) ×2 IMPLANT
GLOVE BIO SURGEON STRL SZ 6 (GLOVE) ×1 IMPLANT
GLOVE BIO SURGEON STRL SZ7.5 (GLOVE) ×2 IMPLANT
GLOVE BIOGEL PI IND STRL 6.5 (GLOVE) IMPLANT
GLOVE BIOGEL PI IND STRL 7.0 (GLOVE) IMPLANT
GLOVE BIOGEL PI IND STRL 7.5 (GLOVE) ×1 IMPLANT
GLOVE BIOGEL PI INDICATOR 6.5 (GLOVE) ×1
GLOVE BIOGEL PI INDICATOR 7.0 (GLOVE) ×2
GLOVE BIOGEL PI INDICATOR 7.5 (GLOVE) ×1
GLOVE SURG SIGNA 7.5 PF LTX (GLOVE) ×2 IMPLANT
GLOVE SURG SS PI 6.5 STRL IVOR (GLOVE) ×2 IMPLANT
GOWN PREVENTION PLUS XXLARGE (GOWN DISPOSABLE) ×1 IMPLANT
GOWN STRL NON-REIN LRG LVL3 (GOWN DISPOSABLE) ×5 IMPLANT
GOWN STRL REIN XL XLG (GOWN DISPOSABLE) ×2 IMPLANT
IV CATH 14GX2 1/4 (CATHETERS) ×2 IMPLANT
KIT BASIN OR (CUSTOM PROCEDURE TRAY) ×2 IMPLANT
KIT ROOM TURNOVER OR (KITS) ×2 IMPLANT
NS IRRIG 1000ML POUR BTL (IV SOLUTION) ×2 IMPLANT
PAD ARMBOARD 7.5X6 YLW CONV (MISCELLANEOUS) ×4 IMPLANT
POUCH SPECIMEN RETRIEVAL 10MM (ENDOMECHANICALS) ×2 IMPLANT
SCISSORS LAP 5X35 DISP (ENDOMECHANICALS) ×2 IMPLANT
SET IRRIG TUBING LAPAROSCOPIC (IRRIGATION / IRRIGATOR) ×2 IMPLANT
SLEEVE Z-THREAD 5X100MM (TROCAR) ×2 IMPLANT
SPECIMEN JAR SMALL (MISCELLANEOUS) ×2 IMPLANT
STOPCOCK 4 WAY LG BORE MALE ST (IV SETS) ×2 IMPLANT
SUT VIC AB 5-0 PS2 18 (SUTURE) ×2 IMPLANT
TOWEL OR 17X24 6PK STRL BLUE (TOWEL DISPOSABLE) ×2 IMPLANT
TOWEL OR 17X26 10 PK STRL BLUE (TOWEL DISPOSABLE) ×2 IMPLANT
TRAY LAPAROSCOPIC (CUSTOM PROCEDURE TRAY) ×2 IMPLANT
TROCAR XCEL BLUNT TIP 100MML (ENDOMECHANICALS) ×2 IMPLANT
TROCAR Z-THREAD FIOS 11X100 BL (TROCAR) ×3 IMPLANT
TROCAR Z-THREAD FIOS 5X100MM (TROCAR) ×3 IMPLANT
TUBING EXTENTION W/L.L. (IV SETS) ×2 IMPLANT
WATER STERILE IRR 1000ML POUR (IV SOLUTION) IMPLANT

## 2012-02-20 NOTE — Brief Op Note (Addendum)
02/19/2012 - 02/20/2012  12:07 PM  PATIENT:  Alexandra Reilly, 26 y.o., female, MRN: [id number]  PREOP DIAGNOSIS:  gallbladder disease  POSTOP DIAGNOSIS:   Chronic cholecystitis, cholelithiasis, non emptying CBD on cholangiography.  [Spoke with Dr. Loreta Ave - will repeat labs in AM.  If LFT's are better, will go home.  If LFT's worse, they will see.  DN  02/20/2012]  PROCEDURE:   Procedure(s): LAPAROSCOPIC CHOLECYSTECTOMY WITH INTRAOPERATIVE CHOLANGIOGRAM  SURGEON:   Ovidio Kin, M.D.  ASSISTANTDonell Beers, MD  ANESTHESIA:   general  Rivka Barbara, MD - Anesthesiologist Ellin Goodie, CRNA - CRNA Raiford Simmonds, MD - Anesthesiologist Judie Petit, MD - Anesthesiologist  General  EBL:  Minimal  ml  BLOOD ADMINISTERED: none  DRAINS: none   LOCAL MEDICATIONS USED:   30 cc 1/4% marcaine  SPECIMEN:   Gall bladder  COUNTS CORRECT:  YES  INDICATIONS FOR PROCEDURE:  Alexandra Reilly is a 26 y.o. (DOB: 16-Apr-1986) AA female whose primary care physician is No primary provider on file. and comes for cholecystectomy   The indications and risks of the surgery were explained to the patient.  The risks include, but are not limited to, infection, bleeding, and nerve injury.  Note dictated to:   #[phone]

## 2012-02-20 NOTE — Anesthesia Procedure Notes (Signed)
Procedure Name: Intubation Date/Time: 02/20/2012 10:00 AM Performed by: Ellin Goodie Pre-anesthesia Checklist: Patient identified, Emergency Drugs available, Suction available, Patient being monitored and Timeout performed Patient Re-evaluated:Patient Re-evaluated prior to inductionOxygen Delivery Method: Circle system utilized Preoxygenation: Pre-oxygenation with 100% oxygen Intubation Type: IV induction Ventilation: Mask ventilation without difficulty Laryngoscope Size: Mac and 3 Grade View: Grade I Tube type: Oral Tube size: 7.0 mm Placement Confirmation: ETT inserted through vocal cords under direct vision,  positive ETCO2 and breath sounds checked- equal and bilateral Secured at: 22 cm Tube secured with: Tape Dental Injury: Teeth and Oropharynx as per pre-operative assessment

## 2012-02-20 NOTE — Anesthesia Preprocedure Evaluation (Signed)
Anesthesia Evaluation  Patient identified by MRN, date of birth, ID band Patient awake    Reviewed: Allergy & Precautions, H&P , NPO status , Patient's Chart, lab work & pertinent test results  History of Anesthesia Complications Negative for: history of anesthetic complications  Airway Mallampati: II TM Distance: >3 FB Neck ROM: Full    Dental  (+) Dental Advisory Given   Pulmonary neg pulmonary ROS,          Cardiovascular  Pt reports enlarged heart- no follow up has been needed, Asx   Neuro/Psych negative neurological ROS  negative psych ROS   GI/Hepatic Neg liver ROS, Heartburn treats with Maalox   Endo/Other  negative endocrine ROS  Renal/GU negative Renal ROS  negative genitourinary   Musculoskeletal negative musculoskeletal ROS (+)   Abdominal   Peds  Hematology negative hematology ROS (+)   Anesthesia Other Findings   Reproductive/Obstetrics negative OB ROS Denies preg and neg preg test                           Anesthesia Physical Anesthesia Plan  ASA: II  Anesthesia Plan: General   Post-op Pain Management:    Induction:   Airway Management Planned: Oral ETT  Additional Equipment:   Intra-op Plan:   Post-operative Plan: Extubation in OR  Informed Consent:   Dental advisory given  Plan Discussed with:   Anesthesia Plan Comments:         Anesthesia Quick Evaluation

## 2012-02-20 NOTE — Preoperative (Signed)
Beta Blockers   Reason not to administer Beta Blockers:Not Applicable 

## 2012-02-20 NOTE — Progress Notes (Signed)
Rm - 5148  Reviewed records/patient has gall bladder disease.  Grandmother at bedside.  She had her gall bladder out about one year ago.  Mother and husband to come to hospital later.  I discussed with the patient the indications and risks of gall bladder surgery.  The primary risks of gall bladder surgery include, but are not limited to, bleeding, infection, common bile duct injury, and open surgery.  There is also the risk that the patient may have continued symptoms after surgery.  However, the likelihood of improvement in symptoms and return to the patient's normal status is good. We discussed the typical post-operative recovery course. I tried to answer the patient's questions.  For surgery later this AM.

## 2012-02-20 NOTE — Transfer of Care (Signed)
Immediate Anesthesia Transfer of Care Note  Patient: Alexandra Reilly  Procedure(s) Performed: Procedure(s) (LRB): LAPAROSCOPIC CHOLECYSTECTOMY WITH INTRAOPERATIVE CHOLANGIOGRAM (N/A)  Patient Location: PACU  Anesthesia Type: General  Level of Consciousness: awake  Airway & Oxygen Therapy: Patient Spontanous Breathing  Post-op Assessment: Report given to PACU RN  Post vital signs: stable  Complications: No apparent anesthesia complications

## 2012-02-20 NOTE — ED Provider Notes (Signed)
Medical screening examination/treatment/procedure(s) were conducted as a shared visit with non-physician practitioner(s) and myself.  I personally evaluated the patient during the encounter 26 yo woman developed RUQ pain last night, worse this AM. Exam shows her to be an obese woman in mild-moderate distress with abdominal pain, exam showing RUQ tenderness, and ultrasound showing gallstones and GB wall thickening.  Asked Dr. Lindie Spruce, general surgeon on call, to see her.  Carleene Cooper III, MD 02/20/12 1004

## 2012-02-20 NOTE — Anesthesia Postprocedure Evaluation (Signed)
  Anesthesia Post-op Note  Patient: Alexandra Reilly  Procedure(s) Performed: Procedure(s) (LRB): LAPAROSCOPIC CHOLECYSTECTOMY WITH INTRAOPERATIVE CHOLANGIOGRAM (N/A)  Patient Location: PACU  Anesthesia Type: General  Level of Consciousness: awake  Airway and Oxygen Therapy: Patient Spontanous Breathing  Post-op Pain: mild  Post-op Assessment: Post-op Vital signs reviewed  Post-op Vital Signs: Reviewed  Complications: No apparent anesthesia complications

## 2012-02-21 ENCOUNTER — Encounter (HOSPITAL_COMMUNITY): Payer: Self-pay | Admitting: *Deleted

## 2012-02-21 ENCOUNTER — Inpatient Hospital Stay (HOSPITAL_COMMUNITY): Payer: 59

## 2012-02-21 ENCOUNTER — Encounter (HOSPITAL_COMMUNITY): Admission: EM | Disposition: A | Discharge status: Home / Self Care | Payer: Self-pay | Source: Home / Self Care

## 2012-02-21 HISTORY — PX: ERCP: SHX5425

## 2012-02-21 LAB — COMPREHENSIVE METABOLIC PANEL
Albumin: 3.2 g/dL — ABNORMAL LOW (ref 3.5–5.2)
BUN: <3 mg/dL — ABNORMAL LOW (ref 6–23)
Calcium: 8.6 mg/dL (ref 8.4–10.5)
Creatinine, Ser: 0.5 mg/dL (ref 0.50–1.10)
GFR calc Af Amer: >90 mL/min (ref >90)
Glucose, Bld: 119 mg/dL — ABNORMAL HIGH (ref 70–99)
Total Protein: 6.8 g/dL (ref 6.0–8.3)

## 2012-02-21 SURGERY — ERCP, WITH INTERVENTION IF INDICATED
Anesthesia: Moderate Sedation

## 2012-02-21 MED ORDER — SODIUM CHLORIDE 0.9 % IV SOLN
INTRAVENOUS | Status: DC | PRN
  Administered 2012-02-21: 14:00:00

## 2012-02-21 MED ORDER — FENTANYL CITRATE 0.05 MG/ML IJ SOLN
INTRAMUSCULAR | Status: DC | PRN
  Administered 2012-02-21 (×5): 25 ug via INTRAVENOUS

## 2012-02-21 MED ORDER — GLUCAGON HCL (RDNA) 1 MG IJ SOLR
INTRAMUSCULAR | Status: AC
  Filled 2012-02-21: qty 2

## 2012-02-21 MED ORDER — MIDAZOLAM HCL 10 MG/2ML IJ SOLN
INTRAMUSCULAR | Status: AC
  Filled 2012-02-21: qty 4

## 2012-02-21 MED ORDER — MIDAZOLAM HCL 10 MG/2ML IJ SOLN
INTRAMUSCULAR | Status: DC | PRN
  Administered 2012-02-21 (×5): 2 mg via INTRAVENOUS

## 2012-02-21 MED ORDER — DIPHENHYDRAMINE HCL 50 MG/ML IJ SOLN
INTRAMUSCULAR | Status: AC
  Filled 2012-02-21: qty 1

## 2012-02-21 MED ORDER — FENTANYL CITRATE 0.05 MG/ML IJ SOLN
INTRAMUSCULAR | Status: AC
  Filled 2012-02-21: qty 4

## 2012-02-21 MED ORDER — SODIUM CHLORIDE 0.9 % IV SOLN: Freq: Once | INTRAVENOUS | Status: DC

## 2012-02-21 MED ORDER — HEPARIN SODIUM (PORCINE) 5000 UNIT/ML IJ SOLN
5000.0000 [IU] | Freq: Three times a day (TID) | INTRAMUSCULAR | Status: DC
  Administered 2012-02-22: 5000 [IU] via SUBCUTANEOUS
  Filled 2012-02-21 (×4): qty 1

## 2012-02-21 MED ORDER — CIPROFLOXACIN IN D5W 400 MG/200ML IV SOLN
400.0000 mg | Freq: Once | INTRAVENOUS | Status: DC
  Filled 2012-02-21 (×2): qty 200

## 2012-02-21 NOTE — Progress Notes (Signed)
Patient ID: Alexandra Reilly, female   DOB: [birth year], 26 y.o.   MRN: [id number] 1 Day Post-Op  Subjective: Pt feels pretty good this morning.  Some soreness.  Tolerating clears.    Objective: Vital signs in last 24 hours: Temp:  [97.9 F (36.6 C)-99.3 F (37.4 C)] 98.3 F (36.8 C) (04/02 0515) Pulse Rate:  [68-79] 79  (04/02 0515) Resp:  [16-24] 20  (04/02 0515) BP: (111-137)/(70-79) 137/79 mmHg (04/02 0515) SpO2:  [99 %-100 %] 100 % (04/02 0515) Last BM Date: 03/20/12  Intake/Output from previous day: 04/01 0701 - 04/02 0700 In: 3900 [P.O.:600; I.V.:3300] Out: 300 [Urine:300] Intake/Output this shift:    PE: Abd: soft, appropriately tender, +BS, ND, obese  Lab Results:   Basename 02/20/12 0604 02/19/12 0820  WBC 6.2 8.8  HGB 13.4 14.0  HCT 40.2 41.5  PLT 261 300   BMET  Basename 02/21/12 0505 02/20/12 0604  NA 139 140  K 4.6 3.9  CL 104 108  CO2 22 25  GLUCOSE 119* 92  BUN <3* 3*  CREATININE 0.50 0.59  CALCIUM 8.6 8.6   PT/INR No results found for this basename: LABPROT:2,INR:2 in the last 72 hours CMP     Component Value Date/Time   NA 139 02/21/2012 0505   K 4.6 02/21/2012 0505   CL 104 02/21/2012 0505   CO2 22 02/21/2012 0505   GLUCOSE 119* 02/21/2012 0505   BUN <3* 02/21/2012 0505   CREATININE 0.50 02/21/2012 0505   CALCIUM 8.6 02/21/2012 0505   PROT 6.8 02/21/2012 0505   ALBUMIN 3.2* 02/21/2012 0505   AST 272* 02/21/2012 0505   ALT 303* 02/21/2012 0505   ALKPHOS 152* 02/21/2012 0505   BILITOT 3.3* 02/21/2012 0505   GFRNONAA >90 02/21/2012 0505   GFRAA >90 02/21/2012 0505   Lipase     Component Value Date/Time   LIPASE 29 02/19/2012 0820       Studies/Results: Dg Cholangiogram Operative  02/20/2012  *RADIOLOGY REPORT*  Clinical Data: Laparoscopic cholecystectomy  INTRAOPERATIVE CHOLANGIOGRAM  Comparison:  Ultrasound 02/19/2012  Findings: Contrast is injected through the cystic duct remnant. Initially, there is good filling of the intra and extrahepatic ductal  system without contrast passing into the duodenum.  There is a suggestion of filling defects in the distal common bile duct. The last series was performed after glucagon administration and again no contrast passes into the duodenum.  This suggest the presence of obstructing stones in the distal duct.  IMPRESSION: No passage of contrast into the duodenum.  Small filling defects in the distal common bile duct.  Findings suggest obstructing stones at the ampulla.  Last images were done after glucagon administration, by history.  Original Report Authenticated By: Thomasenia Sales, M.D.   US Abdomen Complete  02/19/2012  *RADIOLOGY REPORT*  Clinical Data:  Recurrent abdominal pain.  Epigastric and right upper quadrant pain.  COMPLETE ABDOMINAL ULTRASOUND  Comparison:  No priors.  Findings:  Gallbladder:  Filled with echogenic shadowing material presumably multiple gallstones. The gallbladder is only moderately distended, however, there is severe gallbladder wall thickening (13 mm).  No pericholecystic fluid identified.  Per report, the patient's sonographic Murphy's sign was equivocal, however, the patient had been given pain medication.  Common bile duct:  7 mm in diameter in the porta hepatis.  The distal common bile duct could not be visualized.  Liver:  No focal lesion identified.  Within normal limits in parenchymal echogenicity.  IVC:  Appears normal.  Pancreas:  The  head and body of the pancreas are normal in echotexture and appearance.  The distal tail cannot be visualized secondary to overlying bowel gas.  Spleen:  Normal in echotexture appearance measuring 8.7 cm in length.  Right Kidney:  Normal in echotexture and appearance, without focal cystic or solid lesions, measuring 11.8 cm in length.  No definite stones.  No signs of hydronephrosis.  Left Kidney:  Normal in echotexture appearance measuring 12.8 cm in length.  No focal cystic or solid lesions, and no definite stones or hydronephrosis.  Abdominal  aorta:  Measures up to 2.1 cm in diameter proximally, and tapers appropriately distally.  IMPRESSION: 1.The study demonstrates extensive cholelithiasis, with marked gallbladder wall thickening (13 mm).  Findings are suggestive of acute cholecystitis, however, there is no pericholecystic fluid noted, and assessment for sonographic Murphy's sign was limited by administration of pain medication (sonographic Murphy's sign was equivocal). 2.  Common bile duct is mildly dilated (7 mm).  This could suggest the presence of a distal common bile duct stone.  No definite intrahepatic biliary ductal dilatation noted at this time.  Original Report Authenticated By: Florencia Reasons, M.D.    Anti-infectives: Anti-infectives     Start     Dose/Rate Route Frequency Ordered Stop   02/19/12 1130   ciprofloxacin (CIPRO) IVPB 400 mg  Status:  Discontinued        400 mg 200 mL/hr over 60 Minutes Intravenous Every 12 hours 02/19/12 1120 02/20/12 1313           Assessment/Plan  1. S/p lap chole - Non emptying CDB on intra operative cholangiogram.  Her liver functions have increased. 2. +IOC with hyperbilirubinemia, likely retained CBD stone  Plan: 1. Will make NPO after breakfast for possible ERCP today.  GI to evaluate.  Patient seen by Dr. Chip Boer - for ERCP today. 2. Recheck labs in the morning.  Advance diet after ERCP.    LOS: 2 days    OSBORNE,KELLY E 02/21/2012  Ovidio Kin, MD, [facility] Surgery Pager: [phone] Office phone:  [phone]

## 2012-02-21 NOTE — H&P (View-Only) (Signed)
Rm - 5148  Reviewed records/patient has gall bladder disease.  Grandmother at bedside.  She had her gall bladder out about one year ago.  Mother and husband to come to hospital later.  I discussed with the patient the indications and risks of gall bladder surgery.  The primary risks of gall bladder surgery include, but are not limited to, bleeding, infection, common bile duct injury, and open surgery.  There is also the risk that the patient may have continued symptoms after surgery.  However, the likelihood of improvement in symptoms and return to the patient's normal status is good. We discussed the typical post-operative recovery course. I tried to answer the patient's questions.  For surgery later this AM.  

## 2012-02-21 NOTE — Consult Note (Signed)
I reviewed the lab data.  Her TB has increased and the IOC suggests obstruction from small stones.  I discussed the ERCP procedure in detail with the patient and her parents.  Specifically, I discussed the risks of bleeding, infection, perforation, medication reactions, and pancreatitis.  The acknowledge the risks, benefits, and alternatives, and wish to proceed.

## 2012-02-21 NOTE — Op Note (Signed)
[facility] [address] [place], [place]  [address]  ERCP PROCEDURE REPORT  PATIENT:  Alexandra Reilly, Alexandra Reilly  MR#:  [id number] BIRTHDATE:  Apr 09, 1986  GENDER:  female ENDOSCOPIST:  Jeani Hawking, MD PROCEDURE DATE:  02/21/2012 PROCEDURE:  ERCP with removal of stones ASA CLASS:  Class II INDICATIONS:  stone MEDICATIONS:   Fentanyl 125 mcg IV, Versed 10 mg IV  DESCRIPTION OF PROCEDURE:   After the risks benefits and alternatives of the procedure were thoroughly explained, informed consent was obtained.  The [id number] ([id number]) endoscope was introduced through the mouth and advanced to the second portion of the duodenum.  FINDINGS:  Gross visualization of the ampulla revealed impacted stone fragments. fortunately the CBD was easily cannulated. No cannulation of the PD occurred. The Al Pimple was secured in the left intrahepatic ducts. Contrast injection revaled lucencies in the distal CBD. The CBD was also dilated to approximately 1 cm. A 1.5 cm sphincterotomy was created and the CBD was swept three times. During the first sweep a small stone was extracted. The final occlusion cholangiogram was negative for any retained stones. No immediate complications encountered.    The scope was then completely withdrawn from the patient and the procedure terminated. <<PROCEDUREIMAGES>>  COMPLICATIONS:  None  ENDOSCOPIC IMPRESSION:  1) Choledocholithiasis s/p successful extraction. RECOMMENDATIONS: 1) Routine post-op management per surgery.  ______________________________ Jeani Hawking, MD  n. Rosalie DoctorJeani Hawking at 02/21/2012 02:08 PM  Levonne Lapping, [phone]

## 2012-02-21 NOTE — Consult Note (Signed)
Reason for Consult: Increased LFTs -Post cholecystectomy Referring Physician: Dr. Dyke [name].   HPI: Alexandra Reilly is a pleasant 26 year old woman who is admitted for acute on chronic cholecystitis and cholelithiasis- status post lap cholecystectomy yesterday- with intraoperative cholangiogram showing non-emptying common bile duct. LFTs increasing post surgery. AST/ALT- 272/303< 214/243 < 19/11. Bili- 3.3<1.1<0.5 Patient with soreness at laparoscopic incision site but no significant abdominal pain or tenderness. Last bowel movement one day prior to admission. MAXIMUM TEMPERATURE 99.3 last 24 hours. Denies any nausea, vomiting, chest pain, short of breath. Preop abdominal ultrasound showed mildly dilated CBD- 7 mm- with possibility of distal CBD stone.    Past Medical History  Diagnosis Date  . Enlarged heart     Past Surgical History  Procedure Date  . No past surgeries     Family History  Problem Relation Age of Onset  . Anesthesia problems Neg Hx     Social History:  reports that she has never smoked. She does not have any smokeless tobacco history on file. She reports that she does not drink alcohol or use illicit drugs.  Allergies:  Allergies  Allergen Reactions  . Amoxicillin Other (See Comments)    Childhood allergy; reaction unknown    Medications: I have reviewed the patient's current medications.  Results for orders placed during the hospital encounter of 02/19/12 (from the past 48 hour(s))  COMPREHENSIVE METABOLIC PANEL     Status: Abnormal   Collection Time   02/20/12  6:04 AM      Component Value Range Comment   Sodium 140  135 - 145 (mEq/L)    Potassium 3.9  3.5 - 5.1 (mEq/L)    Chloride 108  96 - 112 (mEq/L)    CO2 25  19 - 32 (mEq/L)    Glucose, Bld 92  70 - 99 (mg/dL)    BUN 3 (*) 6 - 23 (mg/dL)    Creatinine, Ser 1.61  0.50 - 1.10 (mg/dL)    Calcium 8.6  8.4 - 10.5 (mg/dL)    Total Protein 6.6  6.0 - 8.3 (g/dL)    Albumin 3.2 (*) 3.5 - 5.2 (g/dL)    AST 096 (*) 0 - 37 (U/L)    ALT 243 (*) 0 - 35 (U/L)    Alkaline Phosphatase 131 (*) 39 - 117 (U/L)    Total Bilirubin 1.1  0.3 - 1.2 (mg/dL)    GFR calc non Af Amer >90  >90 (mL/min)    GFR calc Af Amer >90  >90 (mL/min)   CBC     Status: Normal   Collection Time   02/20/12  6:04 AM      Component Value Range Comment   WBC 6.2  4.0 - 10.5 (K/uL)    RBC 4.37  3.87 - 5.11 (MIL/uL)    Hemoglobin 13.4  12.0 - 15.0 (g/dL)    HCT 04.5  40.9 - 81.1 (%)    MCV 92.0  78.0 - 100.0 (fL)    MCH 30.7  26.0 - 34.0 (pg)    MCHC 33.3  30.0 - 36.0 (g/dL)    RDW 91.4  78.2 - 95.6 (%)    Platelets 261  150 - 400 (K/uL)   MRSA PCR SCREENING     Status: Normal   Collection Time   02/20/12  8:54 AM      Component Value Range Comment   MRSA by PCR NEGATIVE  NEGATIVE    COMPREHENSIVE METABOLIC  PANEL     Status: Abnormal   Collection Time   02/21/12  5:05 AM      Component Value Range Comment   Sodium 139  135 - 145 (mEq/L)    Potassium 4.6  3.5 - 5.1 (mEq/L) HEMOLYSIS AT THIS LEVEL MAY AFFECT RESULT   Chloride 104  96 - 112 (mEq/L)    CO2 22  19 - 32 (mEq/L)    Glucose, Bld 119 (*) 70 - 99 (mg/dL)    BUN <3 (*) 6 - 23 (mg/dL)    Creatinine, Ser 1.47  0.50 - 1.10 (mg/dL)    Calcium 8.6  8.4 - 10.5 (mg/dL)    Total Protein 6.8  6.0 - 8.3 (g/dL)    Albumin 3.2 (*) 3.5 - 5.2 (g/dL)    AST 829 (*) 0 - 37 (U/L) HEMOLYSIS AT THIS LEVEL MAY AFFECT RESULT   ALT 303 (*) 0 - 35 (U/L) HEMOLYSIS AT THIS LEVEL MAY AFFECT RESULT   Alkaline Phosphatase 152 (*) 39 - 117 (U/L) HEMOLYSIS AT THIS LEVEL MAY AFFECT RESULT   Total Bilirubin 3.3 (*) 0.3 - 1.2 (mg/dL)    GFR calc non Af Amer >90  >90 (mL/min)    GFR calc Af Amer >90  >90 (mL/min)     Dg Cholangiogram Operative  02/20/2012  *RADIOLOGY REPORT*  Clinical Data: Laparoscopic cholecystectomy  INTRAOPERATIVE CHOLANGIOGRAM  Comparison:  Ultrasound 02/19/2012  Findings: Contrast is injected through the cystic duct remnant.  Initially, there is good filling of the intra and extrahepatic ductal system without contrast passing into the duodenum.  There is a suggestion of filling defects in the distal common bile duct. The last series was performed after glucagon administration and again no contrast passes into the duodenum.  This suggest the presence of obstructing stones in the distal duct.  IMPRESSION: No passage of contrast into the duodenum.  Small filling defects in the distal common bile duct.  Findings suggest obstructing stones at the ampulla.  Last images were done after glucagon administration, by history.  Original Report Authenticated By: Thomasenia Sales, M.D.    ROS:  As stated above in the HPI otherwise negative.  Blood pressure 137/79, pulse 79, temperature 98.3 F (36.8 C), temperature source Oral, resp. rate 20, height 5\' 5"  (1.651 m), weight 238 lb (107.956 kg), SpO2 100.00%.  PE: Gen: NAD, Alert and Oriented HEENT:  Alexandra Reilly/AT, EOMI Neck: Supple, no LAD Lungs: CTA Bilaterally CV: RRR without M/G/R ABM: Soft, minimal tenderness to palpation diffusely, +BS Ext: No C/C/E  Assessment/Plan:  1) CBD obstruction: Patient with increasing LFTs post lap cholecystectomy yesterday. Intra-Op cholangiogram showing non emptying CBD- with possibility of stones obstructing ampulla. - AST/ALT- 272/303< 214/243 < 19/11. Bili- 3.3<1.1<0.5 - Patient with no signs of acute cholangitis at present- no fever or increased RUQ pain/tenderness- although has increased bilirubin. - ERCP today- to relieve obstruction.   Alexandra Reilly 02/21/2012, 11:34 AM

## 2012-02-21 NOTE — Op Note (Signed)
NAMEDA, MICHELLE NO.:  [id number]  MEDICAL RECORD NO.:  [id number]  LOCATION:  5148                         FACILITY:  [facility]  PHYSICIAN:  Sandria Bales. Ezzard Standing, M.D.  DATE OF BIRTH:  10-03-1986  DATE OF PROCEDURE:  02/20/2012                               OPERATIVE REPORT  PREOPERATIVE DIAGNOSES:  Cholecystitis and cholelithiasis.  POSTOPERATIVE DIAGNOSES:  Significant chronic cholecystitis with cholelithiasis, non-emptying common bile duct on cholangiogram.  PROCEDURES:  Laparoscopic cholecystectomy with intraoperative cholangiogram.  (5 port)  SURGEON:  Sandria Bales. Ezzard Standing, M.D.  FIRST ASSIST:  Almond Lint, MD  ANESTHESIA:  General endotracheal.  ESTIMATED BLOOD LOSS:  Minimal.  ANESTHESIA:  Local anesthesia with 30 mL of 4% Marcaine.  COMPLICATION:  None.  INDICATION FOR PROCEDURE:  Ms. Monteith is a 26 year old African American female who is morbidly obese, came to the [facility] Emergency Room yesterday with signs and symptoms of gallbladder disease.  She was admitted by Dr. Megan Mans.  Her initial liver functions were normal; however, her liver functions repeated this morning showed AST of 214 and ALT of 243, and alkaline phos of 131.  On ultrasound, her common bile duct was noted to be mildly enlarged.  I discussed with the patient the indications and potential complications of gallbladder surgery.  Potential complications include, but are not limited to, bleeding, infection, open surgery, and the risk of common bile duct injury.  OPERATIVE NOTE:  The patient was taken to OR, room #17 underwent a general endotracheal anesthetic supervised by Dr. Diamantina Monks. She was given Cipro IV as antibiotic preoperatively.  A time-out was held and surgical checklist run.  I accessed the abdominal cavity through an infraumbilical incision with sharp dissection carried down the abdominal cavity.  A 0 degree 10-mm laparoscope was inserted through a 12-mm Hassan  trocar, this trocar secured with a 0 Vicryl suture.  I placed 4 additional trocars, a 10-mm subxiphoid, a 5 mm in the midline as a second hand from me, a 5 mm right mid subcostal, and then a 10 mm left lateral subcostal because we had to use the two 10 mm graspers to help grab the gallbladder.  Abdominal exploration revealed right and left lobes of liver unremarkable.  Stomach was unremarkable.  The rest of her belly that was just covered with omentum and there really was no way to see the bowel. Her obesity actually made the operation also very hard because her liver and gallbladder are high in the right upper quadrant.  The gallbladder was grasped and it had adhesions around, it consistent with chronic cholecystitis.  He was significantly thick walled and scarred in into the liver bed.  I dissected out, I identified the cystic duct, cystic artery, and the node of Calot.  I placed a clip on the side of the cystic duct and shot intraoperative cholangiogram.  Intraoperative cholangiogram was shot using half-strength Omnipaque about 18 mL.  I shot 3 runs.  This showed free flow of contrast down the cystic duct into the common bile duct, which appeared to be normal size up to hepatic radicals, but the contrast never emptied into the duodenum though the CBD did taper at the distal  common bile duct.  There was no obvious meniscal sign.  I did give the patient 1 mg of Glucagon, waited 2 minutes, re-shot a cholangiogram again, but there was still no emptying of the contrast.  I could not see a filling defect in the distal common bile duct.  The patient is very obese.  The dissection to expose the gall bladder was difficult secondary to the mechanics of her size.  I did not think it prudent to try a ductal exploration laparoscopically.  I will contact GI after the case for further input.  I then put 3 clips across the cystic duct.  I found the cystic artery, which branched into an anterior and  posterior branch placed 3 clips across this.  I then sharply and bluntly dissected the gallbladder from the gallbladder bed, again was chronically scarred into the gallbladder bed.  The gallbladder was removed, placed in EndoCatch bag.  The gallbladder bed bleeding was controlled with Bovie electrocautery. There was no bile leak.  I placed a Surgicel into the gallbladder bed.  I then irrigated the abdomen with about 800 mL of saline.  There was no bile leak or bleeding from the triangle of Calot or from the gallbladder bed.  I then delivered the gallbladder through the umbilicus.  I closed the umbilical incision with a 0 Vicryl suture.  I closed the skin each incision with a 5-0 Vicryl suture, painted the wound with Dermabond and sterilely dressed.    The patient was transferred to recovery room in good condition.  Sponge and needle count were correct at the end of the case.   Sandria Bales. Ezzard Standing, M.D.,  FACS   DHN/MEDQ  D:  02/20/2012  T:  02/20/2012  Job:  [id number]

## 2012-02-21 NOTE — Interval H&P Note (Signed)
History and Physical Interval Note:  02/21/2012 12:21 PM  Alexandra Reilly  has presented today for surgery, with the diagnosis of Gallstones  The various methods of treatment have been discussed with the patient and family. After consideration of risks, benefits and other options for treatment, the patient has consented to  Procedure(s) (LRB): ENDOSCOPIC RETROGRADE CHOLANGIOPANCREATOGRAPHY (ERCP) (N/A) as a surgical intervention .  The patients' history has been reviewed, patient examined, no change in status, stable for surgery.  I have reviewed the patients' chart and labs.  Questions were answered to the patient's satisfaction.     [name] D

## 2012-02-22 ENCOUNTER — Encounter (HOSPITAL_COMMUNITY): Payer: Self-pay | Admitting: Surgery

## 2012-02-22 LAB — COMPREHENSIVE METABOLIC PANEL
Albumin: 3.3 g/dL — ABNORMAL LOW (ref 3.5–5.2)
Alkaline Phosphatase: 177 U/L — ABNORMAL HIGH (ref 39–117)
BUN: <3 mg/dL — ABNORMAL LOW (ref 6–23)
CO2: 25 mEq/L (ref 19–32)
Chloride: 103 mEq/L (ref 96–112)
Creatinine, Ser: 0.55 mg/dL (ref 0.50–1.10)
GFR calc Af Amer: >90 mL/min (ref >90)
GFR calc non Af Amer: >90 mL/min (ref >90)
Glucose, Bld: 98 mg/dL (ref 70–99)
Potassium: 3.8 mEq/L (ref 3.5–5.1)
Total Bilirubin: 1.8 mg/dL — ABNORMAL HIGH (ref 0.3–1.2)

## 2012-02-22 LAB — AMYLASE: Amylase: 144 U/L — ABNORMAL HIGH (ref 0–105)

## 2012-02-22 MED ORDER — HYDROCODONE-ACETAMINOPHEN 5-325 MG PO TABS: 1.0000 | ORAL_TABLET | ORAL | Status: AC | PRN

## 2012-02-22 NOTE — Discharge Instructions (Signed)
[facility] ______[facility] °LAPAROSCOPIC SURGERY: POST OP INSTRUCTIONS °Always review your discharge instruction sheet given to you by the facility where your surgery was performed. °IF YOU HAVE DISABILITY OR FAMILY LEAVE FORMS, YOU MUST BRING THEM TO THE OFFICE FOR PROCESSING.   °DO NOT GIVE THEM TO YOUR DOCTOR. ° °1. A prescription for pain medication may be given to you upon discharge.  Take your pain medication as prescribed, if needed.  If narcotic pain medicine is not needed, then you may take acetaminophen (Tylenol) or ibuprofen (Advil) as needed. °2. Take your usually prescribed medications unless otherwise directed. °3. If you need a refill on your pain medication, please contact your pharmacy.  They will contact our office to request authorization. Prescriptions will not be filled after 5pm or on week-ends. °4. You should follow a light diet the first few days after arrival home, such as soup and crackers, etc.  Be sure to include lots of fluids daily. °5. Most patients will experience some swelling and bruising in the area of the incisions.  Ice packs will help.  Swelling and bruising can take several days to resolve.  °6. It is common to experience some constipation if taking pain medication after surgery.  Increasing fluid intake and taking a stool softener (such as Colace) will usually help or prevent this problem from occurring.  A mild laxative (Milk of Magnesia or Miralax) should be taken according to package instructions if there are no bowel movements after 48 hours. °7. Unless discharge instructions indicate otherwise, you may remove your bandages 24-48 hours after surgery, and you may shower at that time.  You may have steri-strips (small skin tapes) in place directly over the incision.  These strips should be left on the skin for 7-10 days.  If your surgeon used skin glue on the incision, you may shower in 24 hours.  The glue will flake off over the next 2-3 weeks.  Any sutures or  staples will be removed at the office during your follow-up visit. °8. ACTIVITIES:  You may resume regular (light) daily activities beginning the next day--such as daily self-care, walking, climbing stairs--gradually increasing activities as tolerated.  You may have sexual intercourse when it is comfortable.  Refrain from any heavy lifting or straining until approved by your doctor. °a. You may drive when you are no longer taking prescription pain medication, you can comfortably wear a seatbelt, and you can safely maneuver your car and apply brakes. °b. RETURN TO WORK:  __________________________________________________________ °9. You should see your doctor in the office for a follow-up appointment approximately 2-3 weeks after your surgery.  Make sure that you call for this appointment within a day or two after you arrive home to insure a convenient appointment time. °10. OTHER INSTRUCTIONS: __________________________________________________________________________________________________________________________ __________________________________________________________________________________________________________________________ °WHEN TO CALL YOUR DOCTOR: °1. Fever over 101.0 °2. Inability to urinate °3. Continued bleeding from incision. °4. Increased pain, redness, or drainage from the incision. °5. Increasing abdominal pain ° °The clinic staff is available to answer your questions during regular business hours.  Please don’t hesitate to call and ask to speak to one of the nurses for clinical concerns.  If you have a medical emergency, go to the nearest emergency room or call 911.  A surgeon from [facility] is always on call at the hospital. °[address], [address], [place], [place]  [address] ? [address] [address], [place], [place]   [address] °[phone] ? [phone] ? FAX [phone] °Web site:   [email or web address] °

## 2012-02-22 NOTE — Discharge Summary (Signed)
Patient ID: KINZE LABO MRN: [id number] DOB/AGE: [birth year] 26 y.o.  Admit date: 02/19/2012 Discharge date: 02/22/2012  Procedures: lap chole, ERCP  Consults: GI; Dr. Elnoria Howard  Reason for Admission: This is a 26 yo female who presented to the [facility] with RUQ abdominal pain.  She was found to have acute cholecystitis.  Please see admitting H&P for further details.  Admission Diagnoses:  1. Acute cholecystitis, cholelithiasis  Hospital Course: The patient was admitted.  She was taken to the OR the following day.  She underwent a lap chole with IOC.  Her IOC showed a filling defect.  GI was consulted.  The following day her TB elevated to 3.3.  She was taken for an ERCP where debride was extracted.  She did have some pain after this procedure.  Her amylase was slightly elevated on POD#2 at 144, but she was filling much and tolerating a diet.  She was felt stable for discharge home.  PE: Abd: soft, less tender, ND, +BS, incisions are c/d/i  Discharge Diagnoses:  1. Acute cholecystitis, s/p lap chole 2. Choledocholithiasis, s/p ERCP - Dr. Chip Boer on 02/21/2012. 3. Mild post ERCP pancreatitis, relatively asymptomatic  Discharge Medications: Medication List  As of 02/22/2012  9:10 AM   TAKE these medications         alum & mag hydroxide-simeth 200-200-20 MG/5ML suspension   Commonly known as: MAALOX/MYLANTA   Take 15 mLs by mouth every 6 (six) hours as needed. For stomach acid      HYDROcodone-acetaminophen 5-325 MG per tablet   Commonly known as: NORCO   Take 1-2 tablets by mouth every 4 (four) hours as needed.      ibuprofen 200 MG tablet   Commonly known as: ADVIL,MOTRIN   Take 400 mg by mouth every 6 (six) hours as needed. For headache or pain            Discharge Instructions: Follow-up Information    Follow up with Allessandra Bernardi H, MD. Schedule an appointment as soon as possible for a visit in 3 weeks.   Contact information:   [employer] [facility] [address] [place] [address] [phone]          Signed: Letha Cape 02/22/2012, 9:10 AM  Ovidio Kin, MD, [facility] Surgery Pager: [phone] Office phone:  [phone]

## 2012-02-22 NOTE — Progress Notes (Signed)
Discharge instructions reviewed with pt, prescription given and note for work and school given.  Pt verbalized understanding and had no questions.  Pt discharged in stable condition via wheelchair with family.  Hector Shade [place]

## 2012-02-27 ENCOUNTER — Encounter (HOSPITAL_COMMUNITY): Payer: Self-pay | Admitting: Gastroenterology

## 2012-03-02 ENCOUNTER — Telehealth (INDEPENDENT_AMBULATORY_CARE_PROVIDER_SITE_OTHER): Payer: Self-pay

## 2012-03-02 NOTE — Telephone Encounter (Signed)
The pt came in today to drop off her fmla paperwork.  She wanted to know about when she can go back to work.  She is a Lawyer.  The pt states she was told no heavy lifting until 4/30 but her job expects her to return to work on 4/29.  I called Dr Ezzard Standing and he gave the ok for her going back to work on 4/30.  I left her a message to call us if she needs a note for work.

## 2012-03-07 ENCOUNTER — Encounter (INDEPENDENT_AMBULATORY_CARE_PROVIDER_SITE_OTHER): Payer: Self-pay

## 2012-03-15 ENCOUNTER — Encounter (INDEPENDENT_AMBULATORY_CARE_PROVIDER_SITE_OTHER): Payer: Self-pay | Admitting: Surgery

## 2012-03-15 ENCOUNTER — Ambulatory Visit (INDEPENDENT_AMBULATORY_CARE_PROVIDER_SITE_OTHER): Payer: 59 | Admitting: Surgery

## 2012-03-15 VITALS — BP 134/90 | HR 100 | Temp 97.6°F | Ht 65.0 in | Wt 235.0 lb

## 2012-03-15 DIAGNOSIS — K829 Disease of gallbladder, unspecified: Secondary | ICD-10-CM | POA: Insufficient documentation

## 2012-03-15 NOTE — Progress Notes (Signed)
Post Op Visit  Surgery:  Lap chole with IOC   Patient had CBD stones - Dr. Elnoria Howard did an ERCP on 02/21/2012.  Date of Surgery: 02/20/2012  Location of Surgery: [place]  BP 134/90  Pulse 100  Temp(Src) 97.6 F (36.4 C) (Temporal)  Ht 5\' 5"  (1.651 m)  Wt 235 lb (106.595 kg)  BMI 39.11 kg/m2  SpO2 98%  Findings: Doing well.  No complaints.  Some RUQ pain when she breaths deep.  No other complaint.   Return to office PRN  Ovidio Kin, MD, [facility] Surgery Pager: [phone] Office phone:  [phone]

## 2013-01-06 ENCOUNTER — Inpatient Hospital Stay (HOSPITAL_COMMUNITY)
Admission: AD | Admit: 2013-01-06 | Discharge: 2013-01-07 | Disposition: A | Discharge status: Home / Self Care | Payer: 59 | Source: Ambulatory Visit | Attending: Obstetrics & Gynecology | Admitting: Obstetrics & Gynecology

## 2013-01-06 ENCOUNTER — Encounter (HOSPITAL_COMMUNITY): Payer: Self-pay | Admitting: *Deleted

## 2013-01-06 DIAGNOSIS — K5289 Other specified noninfective gastroenteritis and colitis: Secondary | ICD-10-CM

## 2013-01-06 DIAGNOSIS — R112 Nausea with vomiting, unspecified: Secondary | ICD-10-CM | POA: Insufficient documentation

## 2013-01-06 DIAGNOSIS — R197 Diarrhea, unspecified: Secondary | ICD-10-CM | POA: Insufficient documentation

## 2013-01-06 DIAGNOSIS — K529 Noninfective gastroenteritis and colitis, unspecified: Secondary | ICD-10-CM

## 2013-01-06 DIAGNOSIS — A5901 Trichomonal vulvovaginitis: Secondary | ICD-10-CM | POA: Insufficient documentation

## 2013-01-06 DIAGNOSIS — A088 Other specified intestinal infections: Secondary | ICD-10-CM | POA: Insufficient documentation

## 2013-01-06 LAB — URINE MICROSCOPIC-ADD ON

## 2013-01-06 LAB — POCT PREGNANCY, URINE: Preg Test, Ur: NEGATIVE

## 2013-01-06 LAB — URINALYSIS, ROUTINE W REFLEX MICROSCOPIC
Bilirubin Urine: NEGATIVE
Glucose, UA: NEGATIVE mg/dL
Ketones, ur: 15 mg/dL — AB
pH: 6.5 (ref 5.0–8.0)

## 2013-01-06 MED ORDER — METRONIDAZOLE 500 MG PO TABS: 2000.0000 mg | ORAL_TABLET | Freq: Once | ORAL | Status: DC

## 2013-01-06 MED ORDER — ONDANSETRON 8 MG PO TBDP: 8.0000 mg | ORAL_TABLET | Freq: Three times a day (TID) | ORAL | Status: DC | PRN

## 2013-01-06 MED ORDER — ONDANSETRON 8 MG PO TBDP
8.0000 mg | ORAL_TABLET | Freq: Once | ORAL | Status: AC
  Administered 2013-01-06: 8 mg via ORAL
  Filled 2013-01-06: qty 1

## 2013-01-06 NOTE — MAU Provider Note (Signed)
History     CSN: [id number]  Arrival date and time: 01/06/13 2134   First Provider Initiated Contact with Patient 01/06/13 2240      Chief Complaint  Patient presents with  . Nausea  . Emesis  . Diarrhea   HPI This is a 27 y.o. female who is not pregnant who presents with c/o nausea, vomiting and diarrhea for 3 days. Denies dizziness to me. Her daughter was sick also, for lesser time. Has tried only Immodium, which helped.  Denies fever or pain.   RN Note: C/o N&V,diarrhea and dizziness since Thursday; pt's daughter had symptoms a couple of days ago  OB History   Grav Para Term Preterm Abortions TAB SAB Ect Mult Living   3 2 2  0 1 0 0 1 0 2      Past Medical History  Diagnosis Date  . Enlarged heart   . Shortness of breath     on exertion    Past Surgical History  Procedure Laterality Date  . No past surgeries    . Cholecystectomy  02/20/2012    Procedure: LAPAROSCOPIC CHOLECYSTECTOMY WITH INTRAOPERATIVE CHOLANGIOGRAM;  Surgeon: Kandis Cocking, MD;  Location: [facility] OR;  Service: General;  Laterality: N/A;  . Ercp  02/21/2012    Procedure: ENDOSCOPIC RETROGRADE CHOLANGIOPANCREATOGRAPHY (ERCP);  Surgeon: Theda Belfast, MD;  Location: [facility] ENDOSCOPY;  Service: Endoscopy;  Laterality: N/A;    Family History  Problem Relation Age of Onset  . Anesthesia problems Neg Hx   . Hypertension Father   . Diabetes Maternal Grandmother   . Hypertension Maternal Grandmother     History  Substance Use Topics  . Smoking status: Never Smoker   . Smokeless tobacco: Not on file  . Alcohol Use: 0.6 oz/week    1 Glasses of wine per week     Comment: monthly    Allergies:  Allergies  Allergen Reactions  . Amoxicillin Other (See Comments)    Childhood allergy; reaction unknown    Prescriptions prior to admission  Medication Sig Dispense Refill  . acetaminophen (TYLENOL) 500 MG tablet Take 500 mg by mouth every 6 (six) hours as needed.      Marland Kitchen alum & mag hydroxide-simeth  (MAALOX/MYLANTA) 200-200-20 MG/5ML suspension Take 15 mLs by mouth every 6 (six) hours as needed. For stomach acid      . Green Coffee Bean 400 MG CAPS Take 400 mg by mouth 3 (three) times daily.      Marland Kitchen HYDROcodone-acetaminophen (NORCO) 5-325 MG per tablet Take 1 tablet by mouth every 6 (six) hours as needed.      Marland Kitchen ibuprofen (ADVIL,MOTRIN) 200 MG tablet Take 400 mg by mouth every 6 (six) hours as needed. For headache or pain        Review of Systems  Constitutional: Positive for malaise/fatigue. Negative for fever and chills.  Gastrointestinal: Positive for nausea, vomiting and diarrhea. Negative for abdominal pain and constipation.  Genitourinary: Negative for dysuria.  Neurological: Negative for headaches.   Physical Exam   Blood pressure 127/80, pulse 88, temperature 97.7 F (36.5 C), temperature source Oral, resp. rate 18, height 5\' 5"  (1.651 m), weight 220 lb (99.791 kg).  Physical Exam  Constitutional: She is oriented to person, place, and time. She appears well-developed. No distress.  HENT:  Head: Normocephalic.  Cardiovascular: Normal rate.   Respiratory: Effort normal.  GI: Soft. She exhibits no distension and no mass. There is no tenderness. There is no rebound and no guarding.  Genitourinary:  Declined exam  Musculoskeletal: Normal range of motion.  Neurological: She is alert and oriented to person, place, and time.  Skin: Skin is warm and dry. She is not diaphoretic.  Psychiatric: She has a normal mood and affect.   Results for orders placed during the hospital encounter of 01/06/13 (from the past 24 hour(s))  URINALYSIS, ROUTINE W REFLEX MICROSCOPIC     Status: Abnormal   Collection Time    01/06/13 10:08 PM      Result Value Range   Color, Urine YELLOW  YELLOW   APPearance CLOUDY (*) CLEAR   Specific Gravity, Urine 1.025  1.005 - 1.030   pH 6.5  5.0 - 8.0   Glucose, UA NEGATIVE  NEGATIVE mg/dL   Hgb urine dipstick MODERATE (*) NEGATIVE   Bilirubin Urine  NEGATIVE  NEGATIVE   Ketones, ur 15 (*) NEGATIVE mg/dL   Protein, ur 161 (*) NEGATIVE mg/dL   Urobilinogen, UA 0.2  0.0 - 1.0 mg/dL   Nitrite NEGATIVE  NEGATIVE   Leukocytes, UA MODERATE (*) NEGATIVE  URINE MICROSCOPIC-ADD ON     Status: Abnormal   Collection Time    01/06/13 10:08 PM      Result Value Range   Squamous Epithelial / LPF FEW (*) RARE   WBC, UA 3-6  <3 WBC/hpf   RBC / HPF 3-6  <3 RBC/hpf   Bacteria, UA RARE  RARE   Urine-Other TRICHOMONAS PRESENT    POCT PREGNANCY, URINE     Status: None   Collection Time    01/06/13 10:14 PM      Result Value Range   Preg Test, Ur NEGATIVE  NEGATIVE    MAU Course  Procedures  MDM Offered IV hydration, pt declines, doesn't think she needs it.  Discussed + Trichomonas in urine, offered genital cultures for GC/Chlamydia, pt declines.  Assessment and Plan  A:  Viral Gastroenteritis, improving      Trichomoniasis         P:  Rx Zofran       Rx Flagyl.  Suggested she wait until gastroenteritis is resolved to take meds       Discussed need for partner to be treated       Declines other STD testing       Declines IV fluids       Followup with doctor of her choice.         [facility] 01/06/2013, 10:50 PM

## 2013-01-06 NOTE — MAU Note (Signed)
C/o N&V,diarrhea and dizziness since Thursday; pt's daughter had symptoms a couple of days ago;

## 2013-08-13 IMAGING — CR DG CHEST 2V
2 series · 2 of 2 positions shown · non-contrast
Comparison: CT of the chest performed 12/10/2006

CLINICAL DATA: Left-sided chest pain, radiating to the middle of
the chest.

CHEST - 2 VIEW

[w chest pa]
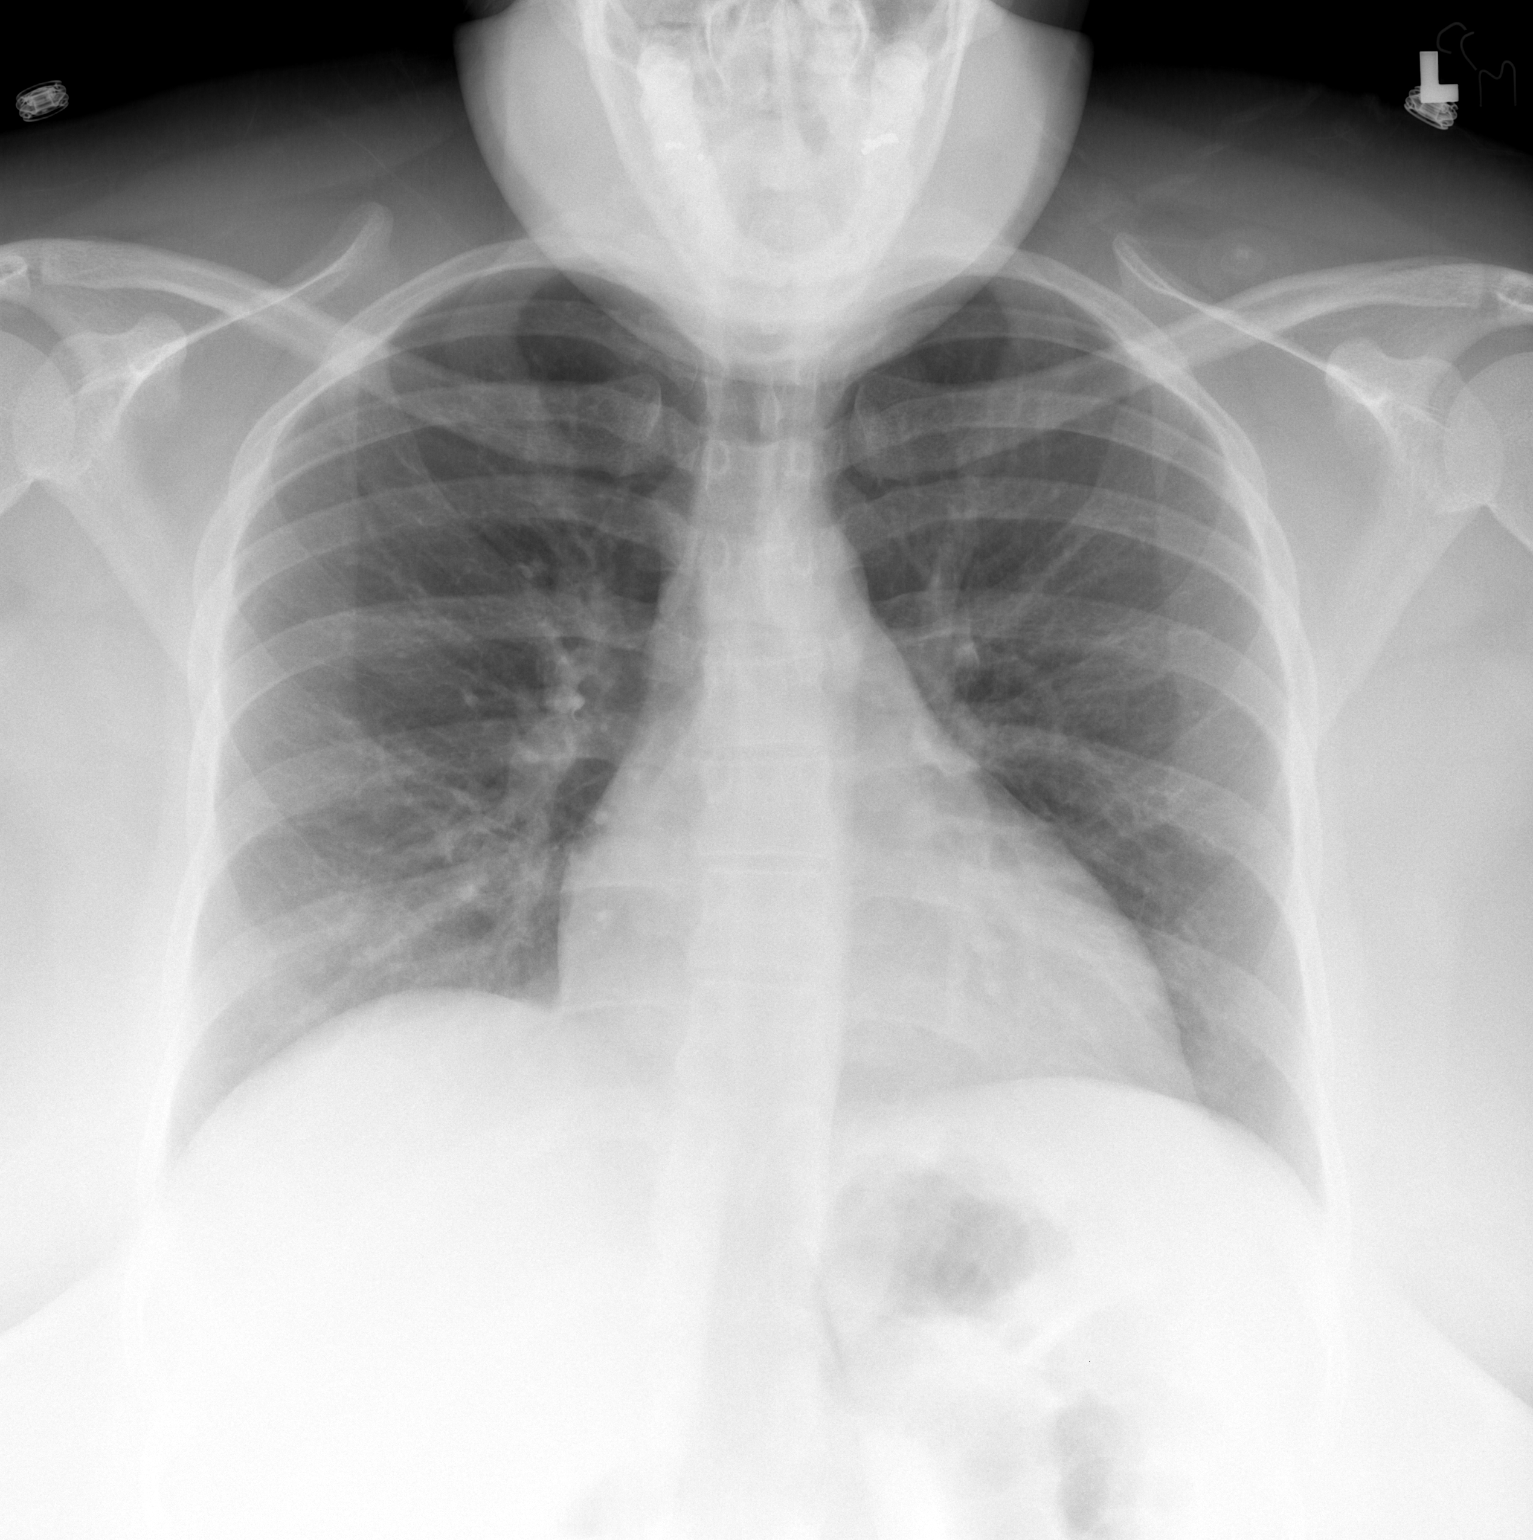

[w chest lat]
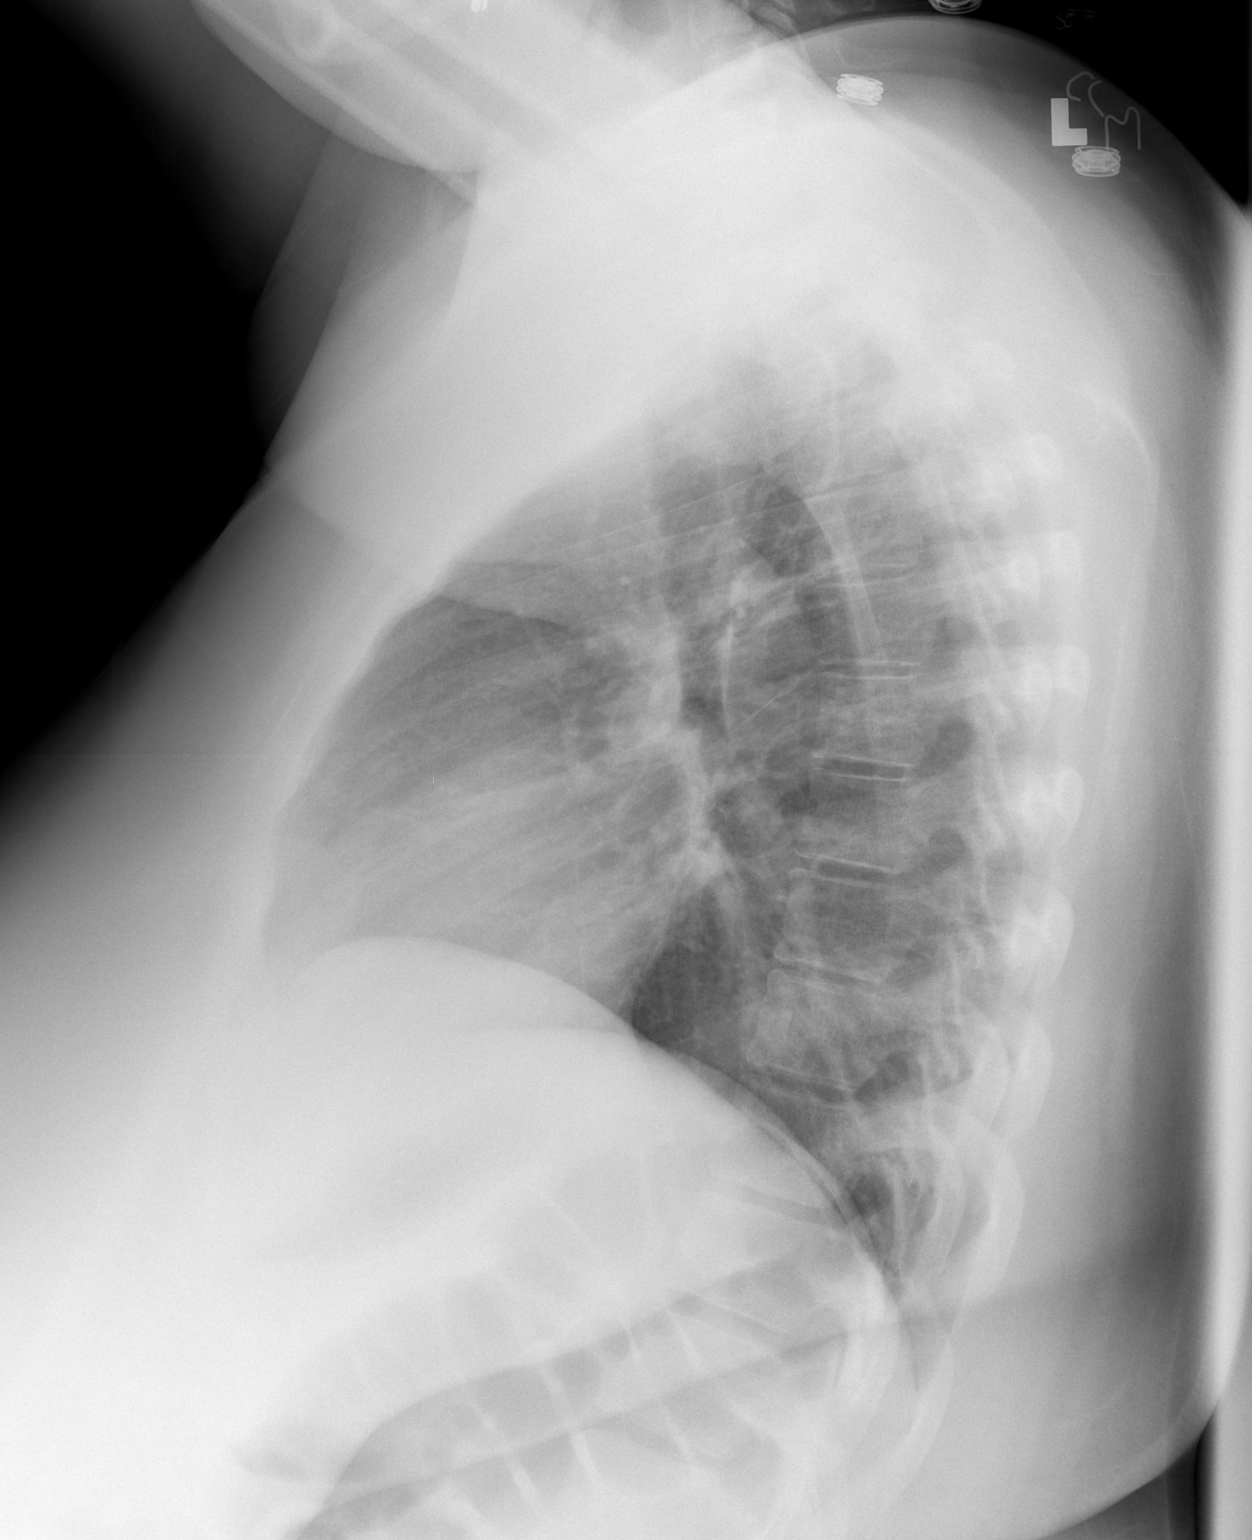

[2 of 2 positions shown; findings below may reference images not displayed]

FINDINGS: The lungs are well-aerated and clear.  There is no
evidence of focal opacification, pleural effusion or pneumothorax.

The heart is normal in size; the mediastinal contour is within
normal limits.  No acute osseous abnormalities are seen.
IMPRESSION: No acute cardiopulmonary process seen.

## 2013-09-18 ENCOUNTER — Encounter (HOSPITAL_COMMUNITY): Payer: Self-pay | Admitting: Emergency Medicine

## 2013-09-18 ENCOUNTER — Emergency Department (HOSPITAL_COMMUNITY)
Admission: EM | Admit: 2013-09-18 | Discharge: 2013-09-18 | Disposition: A | Discharge status: Home / Self Care | Payer: 59 | Attending: Emergency Medicine | Admitting: Emergency Medicine

## 2013-09-18 DIAGNOSIS — M940 Chondrocostal junction syndrome [Tietze]: Secondary | ICD-10-CM | POA: Insufficient documentation

## 2013-09-18 DIAGNOSIS — R0602 Shortness of breath: Secondary | ICD-10-CM | POA: Insufficient documentation

## 2013-09-18 DIAGNOSIS — Z880 Allergy status to penicillin: Secondary | ICD-10-CM | POA: Insufficient documentation

## 2013-09-18 DIAGNOSIS — Z8679 Personal history of other diseases of the circulatory system: Secondary | ICD-10-CM | POA: Insufficient documentation

## 2013-09-18 LAB — BASIC METABOLIC PANEL
Chloride: 102 mEq/L (ref 96–112)
GFR calc Af Amer: >90 mL/min (ref >90)
Potassium: 3.5 mEq/L (ref 3.5–5.1)

## 2013-09-18 LAB — POCT I-STAT TROPONIN I: Troponin i, poc: 0 ng/mL (ref 0.00–0.08)

## 2013-09-18 LAB — CBC
Platelets: 282 10*3/uL (ref 150–400)
RDW: 12.7 % (ref 11.5–15.5)
WBC: 8.1 10*3/uL (ref 4.0–10.5)

## 2013-09-18 NOTE — ED Provider Notes (Signed)
CSN: [id number]     Arrival date & time 09/18/13  1632 History   First MD Initiated Contact with Patient 09/18/13 1805     Chief Complaint  Patient presents with  . Chest Pain    HPI Pt states has had L sided chest pain on/off all day at work, states it's a cramping feeling, states worse when picking things up, states when pain is there she has some shortness of breath, denies any other symptoms.  Pain lasts just a few seconds.  Seems to be worse when she lifts or moves.  Patient denies hemoptysis, swollen legs,.  Past Medical History  Diagnosis Date  . Enlarged heart   . Shortness of breath     on exertion   Past Surgical History  Procedure Laterality Date  . No past surgeries    . Cholecystectomy  02/20/2012    Procedure: LAPAROSCOPIC CHOLECYSTECTOMY WITH INTRAOPERATIVE CHOLANGIOGRAM;  Surgeon: Kandis Cocking, MD;  Location: [facility] OR;  Service: General;  Laterality: N/A;  . Ercp  02/21/2012    Procedure: ENDOSCOPIC RETROGRADE CHOLANGIOPANCREATOGRAPHY (ERCP);  Surgeon: Theda Belfast, MD;  Location: [facility] ENDOSCOPY;  Service: Endoscopy;  Laterality: N/A;   Family History  Problem Relation Age of Onset  . Anesthesia problems Neg Hx   . Hypertension Father   . Diabetes Maternal Grandmother   . Hypertension Maternal Grandmother    History  Substance Use Topics  . Smoking status: Never Smoker   . Smokeless tobacco: Not on file  . Alcohol Use: 0.6 oz/week    1 Glasses of wine per week     Comment: monthly   OB History   Grav Para Term Preterm Abortions TAB SAB Ect Mult Living   3 2 2  0 1 0 0 1 0 2     Review of Systems All other systems reviewed and are negative Allergies  Amoxicillin  Home Medications  No current outpatient prescriptions on file. BP 124/72  Pulse 77  Temp(Src) 98.5 F (36.9 C) (Oral)  Resp 16  SpO2 98% Physical Exam  Nursing note and vitals reviewed. Constitutional: She is oriented to person, place, and time. She appears well-developed and  well-nourished. No distress.  HENT:  Head: Normocephalic and atraumatic.  Eyes: Pupils are equal, round, and reactive to light.  Neck: Normal range of motion.  Cardiovascular: Normal rate and intact distal pulses.   Pulmonary/Chest: No respiratory distress. Bony tenderness: pain is reproducible to palpation where noted.    Palpating rich  Abdominal: Normal appearance. She exhibits no distension.  Musculoskeletal: Normal range of motion.  Neurological: She is alert and oriented to person, place, and time. No cranial nerve deficit.  Skin: Skin is warm and dry. No rash noted.  Psychiatric: She has a normal mood and affect. Her behavior is normal.   PERC negative  ED Course  Procedures (including critical care time) Labs Review Labs Reviewed  CBC  BASIC METABOLIC PANEL  POCT I-STAT TROPONIN I   Imaging Review No results found.  EKG Interpretation     Ventricular Rate:  84 PR Interval:  145 QRS Duration: 95 QT Interval:  370 QTC Calculation: 437 R Axis:   24 Text Interpretation:  Sinus rhythm Low voltage, precordial leads RSR' in V1 or V2, right VCD or RVH Baseline wander in lead(s) V6 No significant change since last tracing            MDM   1. Costochondritis, acute        Shawnda Mauney L  Radford Pax, MD 09/18/13 [phone]

## 2013-09-18 NOTE — ED Notes (Signed)
Pt states has had L sided chest pain on/off all day at work, states it's a cramping feeling, states worse when picking things up, states when pain is there she has some shortness of breath, denies any other symptoms.

## 2014-03-03 IMAGING — RF DG ERCP WO/W SPHINCTEROTOMY
1 series · 3 of 3 positions shown · non-contrast
Comparison: Intraoperative cholangiogram on 02/20/2012.

CLINICAL DATA: Choledocholithiasis suggested on intraoperative
cholangiogram yesterday.

ERCP
TECHNIQUE: Multiple spot images obtained with the fluoroscopic
device and submitted for interpretation post-procedure.  ERCP was
performed by Subramanian. Srath.

[Series 1: run · 3 of 3 slices shown]
[im 1/3]
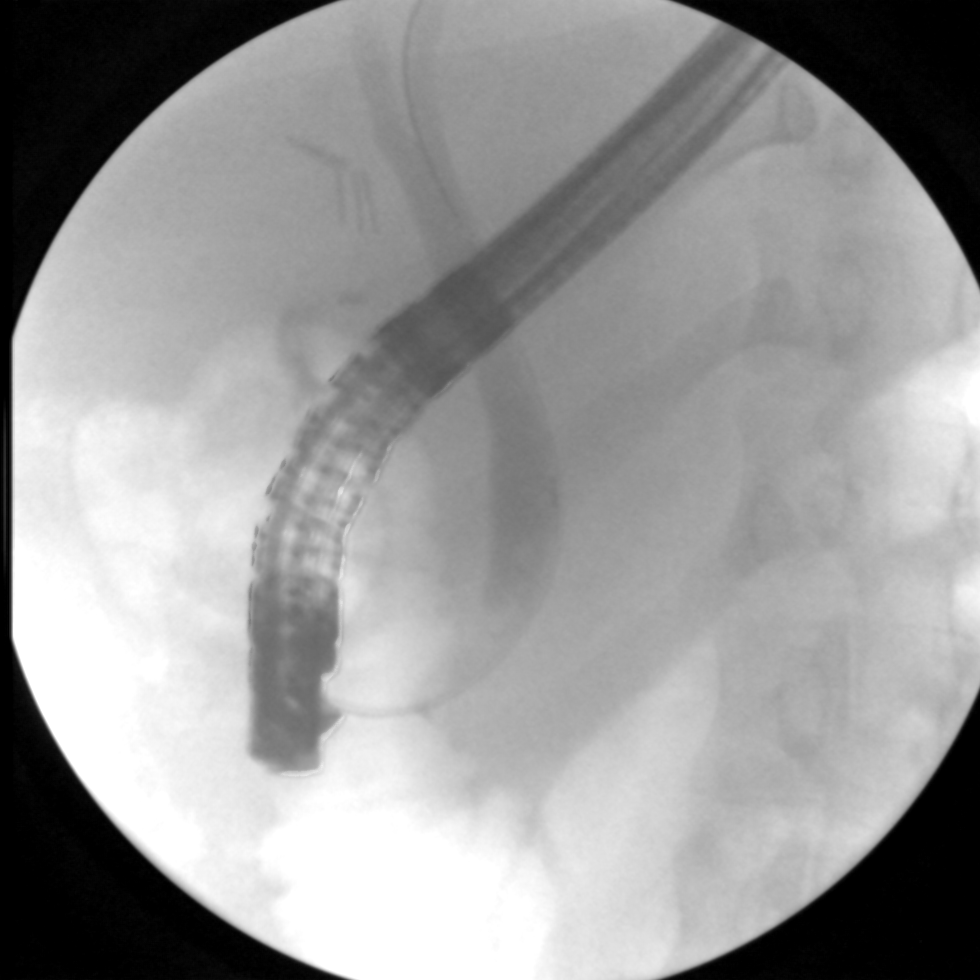
[im 2/3]
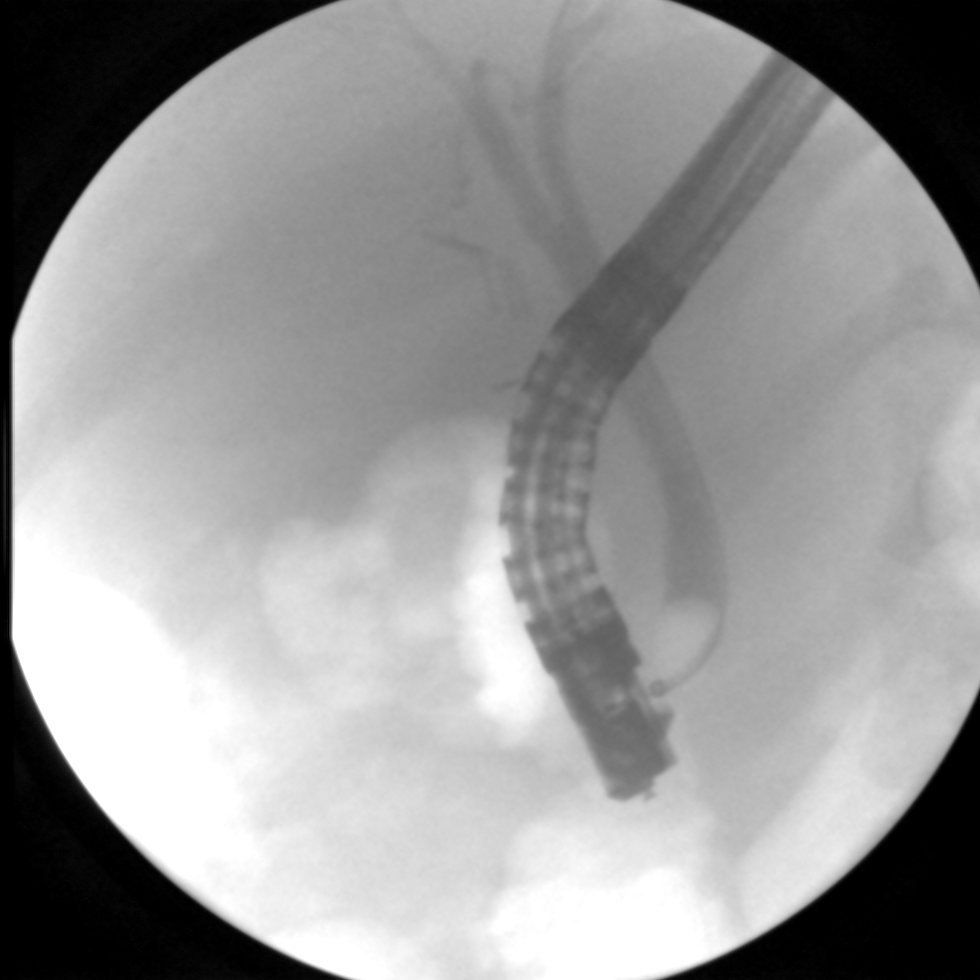
[im 3/3]
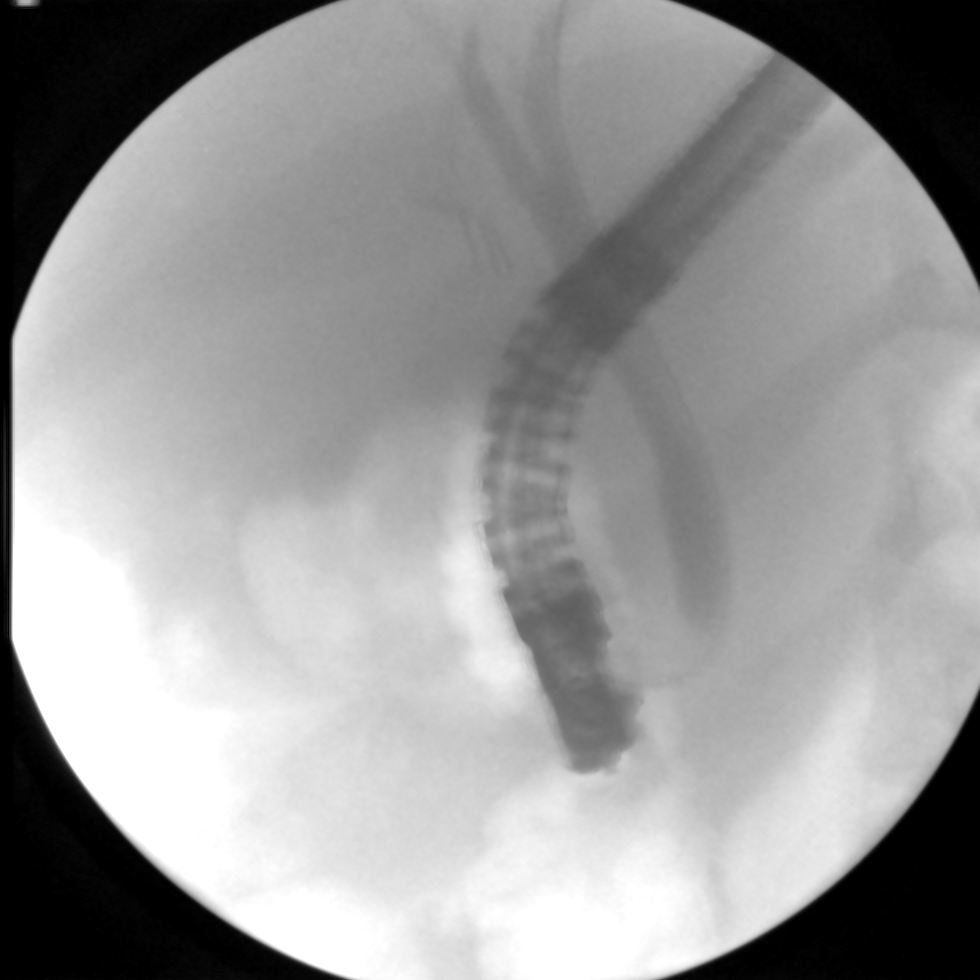

[3 of 3 positions shown; findings below may reference images not displayed]

FINDINGS: Submitted imaging during ERCP demonstrates cannulation of
the common bile duct.  Initial imaging shows suggestion of several
filling defects in the distal common bile duct.  Balloon sweep
maneuver was performed with final cholangiogram demonstrating no
visible residual filling defects in the common bile duct.
IMPRESSION: ERCP demonstrating choledocholithiasis and the balloon sweep stone
extraction.

These images were submitted for radiologic interpretation only.
Please see the procedural report for the amount of contrast and the
fluoroscopy time utilized.

## 2014-08-17 ENCOUNTER — Encounter (HOSPITAL_COMMUNITY): Payer: Self-pay | Admitting: Emergency Medicine

## 2014-08-17 ENCOUNTER — Emergency Department (HOSPITAL_COMMUNITY)
Admission: EM | Admit: 2014-08-17 | Discharge: 2014-08-17 | Disposition: A | Discharge status: Home / Self Care | Payer: 59 | Attending: Emergency Medicine | Admitting: Emergency Medicine

## 2014-08-17 DIAGNOSIS — Z880 Allergy status to penicillin: Secondary | ICD-10-CM | POA: Insufficient documentation

## 2014-08-17 DIAGNOSIS — L0291 Cutaneous abscess, unspecified: Secondary | ICD-10-CM

## 2014-08-17 DIAGNOSIS — IMO0002 Reserved for concepts with insufficient information to code with codable children: Secondary | ICD-10-CM | POA: Insufficient documentation

## 2014-08-17 MED ORDER — LIDOCAINE HCL (PF) 1 % IJ SOLN
5.0000 mL | Freq: Once | INTRAMUSCULAR | Status: AC
  Administered 2014-08-17: 5 mL via INTRADERMAL
  Filled 2014-08-17: qty 5

## 2014-08-17 MED ORDER — OXYCODONE-ACETAMINOPHEN 5-325 MG PO TABS
1.0000 | ORAL_TABLET | Freq: Once | ORAL | Status: AC
  Administered 2014-08-17: 1 via ORAL
  Filled 2014-08-17: qty 1

## 2014-08-17 MED ORDER — HYDROCODONE-ACETAMINOPHEN 5-325 MG PO TABS: 1.0000 | ORAL_TABLET | ORAL | Status: DC | PRN

## 2014-08-17 NOTE — ED Provider Notes (Signed)
CSN: [id number]     Arrival date & time 08/17/14  1543 History   First MD Initiated Contact with Patient 08/17/14 1603     Chief Complaint  Patient presents with  . Abscess     (Consider location/radiation/quality/duration/timing/severity/associated sxs/prior Treatment) Patient is a 28 y.o. female presenting with abscess. The history is provided by the patient and medical records.  Abscess  This is a 28 y.o. F with abscess of right axilla x 1 week.  States progressively increasing in size and began draining purulent yellow fluid yesterday.  No fever or chills.  No prior hx of same.  No hx of MRSA.  Has been taking OTC meds without noted improvement.  Past Medical History  Diagnosis Date  . Enlarged heart   . Shortness of breath     on exertion   Past Surgical History  Procedure Laterality Date  . No past surgeries    . Cholecystectomy  02/20/2012    Procedure: LAPAROSCOPIC CHOLECYSTECTOMY WITH INTRAOPERATIVE CHOLANGIOGRAM;  Surgeon: Kandis Cocking, MD;  Location: [facility] OR;  Service: General;  Laterality: N/A;  . Ercp  02/21/2012    Procedure: ENDOSCOPIC RETROGRADE CHOLANGIOPANCREATOGRAPHY (ERCP);  Surgeon: Theda Belfast, MD;  Location: [facility] ENDOSCOPY;  Service: Endoscopy;  Laterality: N/A;   Family History  Problem Relation Age of Onset  . Anesthesia problems Neg Hx   . Hypertension Father   . Diabetes Maternal Grandmother   . Hypertension Maternal Grandmother    History  Substance Use Topics  . Smoking status: Never Smoker   . Smokeless tobacco: Not on file  . Alcohol Use: 0.6 oz/week    1 Glasses of wine per week     Comment: monthly   OB History   Grav Para Term Preterm Abortions TAB SAB Ect Mult Living   0 1 0 0 1 0 2     Review of Systems  Skin:       Abscess  All other systems reviewed and are negative.     Allergies  Amoxicillin  Home Medications   Prior to Admission medications   Not on File   BP 125/80  Pulse 90  Temp(Src) 98.6 F (37 C)  (Oral)  Resp 16  SpO2 100%  Physical Exam  Nursing note and vitals reviewed. Constitutional: She is oriented to person, place, and time. She appears well-developed and well-nourished. No distress.  HENT:  Head: Normocephalic and atraumatic.  Mouth/Throat: Oropharynx is clear and moist.  Eyes: Conjunctivae and EOM are normal. Pupils are equal, round, and reactive to light.  Neck: Normal range of motion. Neck supple.  Cardiovascular: Normal rate, regular rhythm and normal heart sounds.   Pulmonary/Chest: Effort normal and breath sounds normal. No respiratory distress. She has no wheezes.  Abdominal: Soft. Bowel sounds are normal. There is no tenderness. There is no guarding.  Musculoskeletal: Normal range of motion.  Medium sized abscess of right axilla, approx 3-4cm in diameter that is locally TTP; small amount of green drainage present; foul odor noted; no surrounding erythema, induration, or signs of cellulitis  Neurological: She is alert and oriented to person, place, and time.  Skin: Skin is warm and dry. She is not diaphoretic.  Psychiatric: She has a normal mood and affect.    ED Course  Procedures (including critical care time)\  INCISION AND DRAINAGE Performed by: Domenica Fail PA student under my direct supervision Consent: Verbal consent obtained. Risks and benefits: risks, benefits and alternatives were discussed Type: abscess  Body area: right axilla  Anesthesia: local infiltration  Incision was made with a scalpel.  Local anesthetic: lidocaine 1% without epinephrine  Anesthetic total: 5 ml  Complexity: complex Blunt dissection to break up loculations  Drainage: purulent  Drainage amount: moderate  Packing material: 1/4 inch iodoform gauze  Patient tolerance: Patient tolerated the procedure well with no immediate complications.    Labs Review Labs Reviewed - No data to display  Imaging Review No results found.   EKG Interpretation None       MDM   Final diagnoses:  Abscess   Open and draining abscess of right axilla. After evaluation, further I&D was indicated to allow for more efficient drainage. Patient tolerated this well.  Encouraged warm compresses at home.  Vicodin for pain.  She will FU with urgent care in 2 days for packing removal and wound check.  Discussed plan with patient, he/she acknowledged understanding and agreed with plan of care.  Return precautions given for new or worsening symptoms.  Garlon Hatchet, PA-C 08/17/14 1720

## 2014-08-17 NOTE — ED Notes (Signed)
Pt from home with abscess that has been present for 1 week.  Pt with yellow/green drainage.  No fevers or chills reported. Redness noted to area.  Tender to touch. Currently draining on arrival.

## 2014-08-17 NOTE — Discharge Instructions (Signed)
Take the prescribed medication as directed.  Use warm compresses at home to help aid drainage. Follow-up with urgent care in 2 days for packing removal and wound check. Return to the ED for new or worsening symptoms.

## 2014-08-18 NOTE — ED Provider Notes (Signed)
Medical screening examination/treatment/procedure(s) were performed by non-physician practitioner and as supervising physician I was immediately available for consultation/collaboration.   EKG Interpretation None       Sherle Mello, MD 08/18/14 0146 

## 2014-08-19 ENCOUNTER — Emergency Department (HOSPITAL_COMMUNITY)
Admission: EM | Admit: 2014-08-19 | Discharge: 2014-08-19 | Disposition: A | Discharge status: Home / Self Care | Payer: 59 | Attending: Emergency Medicine | Admitting: Emergency Medicine

## 2014-08-19 ENCOUNTER — Encounter (HOSPITAL_COMMUNITY): Payer: Self-pay | Admitting: Emergency Medicine

## 2014-08-19 DIAGNOSIS — Z5189 Encounter for other specified aftercare: Secondary | ICD-10-CM

## 2014-08-19 DIAGNOSIS — Z4801 Encounter for change or removal of surgical wound dressing: Secondary | ICD-10-CM | POA: Insufficient documentation

## 2014-08-19 DIAGNOSIS — Z880 Allergy status to penicillin: Secondary | ICD-10-CM | POA: Insufficient documentation

## 2014-08-19 DIAGNOSIS — IMO0002 Reserved for concepts with insufficient information to code with codable children: Secondary | ICD-10-CM | POA: Insufficient documentation

## 2014-08-19 DIAGNOSIS — Z8679 Personal history of other diseases of the circulatory system: Secondary | ICD-10-CM | POA: Insufficient documentation

## 2014-08-19 MED ORDER — SULFAMETHOXAZOLE-TRIMETHOPRIM 800-160 MG PO TABS: 1.0000 | ORAL_TABLET | Freq: Two times a day (BID) | ORAL | Status: AC

## 2014-08-19 MED ORDER — SULFAMETHOXAZOLE-TMP DS 800-160 MG PO TABS
1.0000 | ORAL_TABLET | Freq: Once | ORAL | Status: AC
  Administered 2014-08-19: 1 via ORAL
  Filled 2014-08-19: qty 1

## 2014-08-19 MED ORDER — HYDROCODONE-ACETAMINOPHEN 5-325 MG PO TABS: 1.0000 | ORAL_TABLET | Freq: Four times a day (QID) | ORAL | Status: AC | PRN

## 2014-08-19 MED ORDER — HYDROCODONE-ACETAMINOPHEN 5-325 MG PO TABS
2.0000 | ORAL_TABLET | Freq: Once | ORAL | Status: AC
  Administered 2014-08-19: 2 via ORAL
  Filled 2014-08-19: qty 2

## 2014-08-19 NOTE — ED Provider Notes (Signed)
CSN: [id number]     Arrival date & time 08/19/14  1825 History  This chart was scribed for non-physician practitioner, Dierdre Forth, PA-C working with Purvis Sheffield, MD by Alexandra Reilly, ED scribe. This patient was seen in room TR09C/TR09C and the patient's care was started at 8:27 PM.     Chief Complaint  Patient presents with  . Wound Check    The patient said she was seen here in fast track for an abscess and they drained it and sent her home.     The history is provided by the patient and medical records. No language interpreter was used.   HPI Comments: Alexandra Reilly is a 28 y.o. female who presents to the Emergency Department requesting a wound check. Pt states that she had an abscess to her right axilla drained 3 days ago. Abscess is still actively draining. Pt denies being prescribed or taking any antibiotics. She reports taking prescribed pain medication with last dosage at 4:30 PM, however, she still endorses associated soreness to the effected area. Pt denies any prior hx of abscesses. Denies any fever, chills, nausea, emesis, or cough.  Past Medical History  Diagnosis Date  . Enlarged heart   . Shortness of breath     on exertion   Past Surgical History  Procedure Laterality Date  . No past surgeries    . Cholecystectomy  02/20/2012    Procedure: LAPAROSCOPIC CHOLECYSTECTOMY WITH INTRAOPERATIVE CHOLANGIOGRAM;  Surgeon: Kandis Cocking, MD;  Location: [facility] OR;  Service: General;  Laterality: N/A;  . Ercp  02/21/2012    Procedure: ENDOSCOPIC RETROGRADE CHOLANGIOPANCREATOGRAPHY (ERCP);  Surgeon: Theda Belfast, MD;  Location: [facility] ENDOSCOPY;  Service: Endoscopy;  Laterality: N/A;   Family History  Problem Relation Age of Onset  . Anesthesia problems Neg Hx   . Hypertension Father   . Diabetes Maternal Grandmother   . Hypertension Maternal Grandmother    History  Substance Use Topics  . Smoking status: Never Smoker   . Smokeless tobacco: Not on file  . Alcohol  Use: 0.6 oz/week    1 Glasses of wine per week     Comment: monthly   OB History   Grav Para Term Preterm Abortions TAB SAB Ect Mult Living   3 2 2  0 1 0 0 1 0 2     Review of Systems  Constitutional: Negative for fever and chills.  Gastrointestinal: Negative for nausea and vomiting.  Skin: Positive for wound.       Abscess to right axilla      Allergies  Amoxicillin  Home Medications   Prior to Admission medications   Medication Sig Start Date End Date Taking? Authorizing Provider  HYDROcodone-acetaminophen (NORCO/VICODIN) 5-325 MG per tablet Take 1 tablet by mouth every 6 (six) hours as needed. 08/19/14   Remigio Mcmillon, PA-C  sulfamethoxazole-trimethoprim (SEPTRA DS) 800-160 MG per tablet Take 1 tablet by mouth every 12 (twelve) hours. 08/19/14   Jadeyn Hargett, PA-C   Triage vitals:BP 109/67  Pulse 94  Temp(Src) 98.7 F (37.1 C) (Oral)  Resp 18  SpO2 97%  Physical Exam  Nursing note and vitals reviewed. Constitutional: She is oriented to person, place, and time. She appears well-developed and well-nourished. No distress.  HENT:  Head: Normocephalic and atraumatic.  Eyes: Conjunctivae are normal. No scleral icterus.  Neck: Normal range of motion.  Cardiovascular: Normal rate, regular rhythm and intact distal pulses.   Pulmonary/Chest: Effort normal and breath sounds normal.  Abdominal: Soft. She exhibits  no distension. There is no tenderness.  Lymphadenopathy:    She has no cervical adenopathy.  Neurological: She is alert and oriented to person, place, and time.  Skin: Skin is warm and dry. She is not diaphoretic. There is erythema.  Right axilla: packing in place. After packing removal expression of moderate amount of liquid and solid purulent material. Mild induration surrounding incision and drainage sight without significant erythema.   Psychiatric: She has a normal mood and affect.    ED Course  Procedures (including critical care  time)  DIAGNOSTIC STUDIES: Oxygen Saturation is 97% on RA, normal by my interpretation.    COORDINATION OF CARE: 8:32 PM- Advised pt to apply warm compresses to the area. Will write a 2 day note for work as per pt's request. Pt advised of plan for treatment and pt agrees.  Labs Review Labs Reviewed - No data to display  Imaging Review No results found.   EKG Interpretation None      MDM   Final diagnoses:  Wound check, abscess   Alexandra Reilly presents for wound check and packing removal.  Abscess continues to drain purulent material both solid and liquid. Large amount of surrounding induration persists. No packing replaced however patient encouraged to return within 48 hours for repeat wound check. Will begin on Bactrim.    BP 109/67  Pulse 94  Temp(Src) 98.7 F (37.1 C) (Oral)  Resp 18  SpO2 97%  I personally performed the services described in this documentation, which was scribed in my presence. The recorded information has been reviewed and is accurate.    Alexandra ClientHannah Tamyra Fojtik, PA-C 08/19/14 2050

## 2014-08-19 NOTE — ED Notes (Signed)
The patient said she was seen here in fast track for an abscess and they drained it and sent her home.  She was told to come here in two days to get it checked.  She is draining pus, and bloody, foul smelling exudate.  She rates her pain 10/10.  She was not given any antibiotics.

## 2014-08-19 NOTE — Discharge Instructions (Signed)
1. Medications: vicodin, bactrim, usual home medications 2. Treatment: rest, drink plenty of fluids, keep wound clean with warm soap and water, flush regularly 3. Follow Up: Please return to the emergency department in 48 hours for wound check; sooner if you develop fevers or other concerns  Abscess Care After An abscess (also called a boil or furuncle) is an infected area that contains a collection of pus. Signs and symptoms of an abscess include pain, tenderness, redness, or hardness, or you may feel a moveable soft area under your skin. An abscess can occur anywhere in the body. The infection may spread to surrounding tissues causing cellulitis. A cut (incision) by the surgeon was made over your abscess and the pus was drained out. Gauze may have been packed into the space to provide a drain that will allow the cavity to heal from the inside outwards. The boil may be painful for 5 to 7 days. Most people with a boil do not have high fevers. Your abscess, if seen early, may not have localized, and may not have been lanced. If not, another appointment may be required for this if it does not get better on its own or with medications. HOME CARE INSTRUCTIONS   Only take over-the-counter or prescription medicines for pain, discomfort, or fever as directed by your caregiver.  When you bathe, soak and then remove gauze or iodoform packs at least daily or as directed by your caregiver. You may then wash the wound gently with mild soapy water. Repack with gauze or do as your caregiver directs. SEEK IMMEDIATE MEDICAL CARE IF:   You develop increased pain, swelling, redness, drainage, or bleeding in the wound site.  You develop signs of generalized infection including muscle aches, chills, fever, or a general ill feeling.  An oral temperature above 102 F (38.9 C) develops, not controlled by medication. See your caregiver for a recheck if you develop any of the symptoms described above. If medications  (antibiotics) were prescribed, take them as directed. Document Released: 05/26/2005 Document Revised: 01/30/2012 Document Reviewed: 01/21/2008 [facility] CenterExitCare Patient Information 2015 [employer] [place]Care, [place]. This information is not intended to replace advice given to you by your health care provider. Make sure you discuss any questions you have with your health care provider.

## 2014-08-20 NOTE — ED Provider Notes (Signed)
Medical screening examination/treatment/procedure(s) were performed by non-physician practitioner and as supervising physician I was immediately available for consultation/collaboration.   EKG Interpretation None        Aphrodite Harpenau, MD 08/20/14 1812 

## 2014-08-22 ENCOUNTER — Emergency Department (HOSPITAL_COMMUNITY)
Admission: EM | Admit: 2014-08-22 | Discharge: 2014-08-22 | Disposition: A | Discharge status: Home / Self Care | Payer: 59 | Attending: Emergency Medicine | Admitting: Emergency Medicine

## 2014-08-22 ENCOUNTER — Encounter (HOSPITAL_COMMUNITY): Payer: Self-pay | Admitting: Emergency Medicine

## 2014-08-22 DIAGNOSIS — Z9089 Acquired absence of other organs: Secondary | ICD-10-CM | POA: Diagnosis not present

## 2014-08-22 DIAGNOSIS — Z4801 Encounter for change or removal of surgical wound dressing: Secondary | ICD-10-CM | POA: Insufficient documentation

## 2014-08-22 DIAGNOSIS — Z880 Allergy status to penicillin: Secondary | ICD-10-CM | POA: Diagnosis not present

## 2014-08-22 DIAGNOSIS — Z8679 Personal history of other diseases of the circulatory system: Secondary | ICD-10-CM | POA: Diagnosis not present

## 2014-08-22 DIAGNOSIS — Z5189 Encounter for other specified aftercare: Secondary | ICD-10-CM

## 2014-08-22 DIAGNOSIS — Z79899 Other long term (current) drug therapy: Secondary | ICD-10-CM | POA: Diagnosis not present

## 2014-08-22 NOTE — ED Provider Notes (Signed)
Medical screening examination/treatment/procedure(s) were performed by non-physician practitioner and as supervising physician I was immediately available for consultation/collaboration.   EKG Interpretation None        Warnell Foresterrey Nithin Demeo, MD 08/22/14 1023

## 2014-08-22 NOTE — Discharge Instructions (Signed)
Wound Check °Your wound appears healthy today. Your wound will heal gradually over time. Eventually a scar will form that will fade with time. °FACTORS THAT AFFECT SCAR FORMATION: °· People differ in the severity in which they scar.  °· Scar severity varies according to location, size, and the traits you inherited from your parents (genetic predisposition). °· Irritation to the wound from infection, rubbing, or chemical exposure will increase the amount of scar formation. °HOME CARE INSTRUCTIONS  °· If you were given a dressing, you should change it at least once a day or as instructed by your caregiver. If the bandage sticks, soak it off with a solution of hydrogen peroxide. °· If the bandage becomes wet, dirty, or develops a bad smell, change it as soon as possible. °· Look for signs of infection. °· Only take over-the-counter or prescription medicines for pain, discomfort, or fever as directed by your caregiver. °SEEK IMMEDIATE MEDICAL CARE IF:  °· You have redness, swelling, or increasing pain in the wound. °· You notice pus coming from the wound. °· You have a fever. °· You notice a bad smell coming from the wound or dressing. °Document Released: 08/13/2004 Document Revised: 01/30/2012 Document Reviewed: 11/07/2005 °Exit[employer]® Patient Information ©2015 [employer]. This information is not intended to replace advice given to you by your health care provider. Make sure you discuss any questions you have with your health care provider. ° °

## 2014-08-22 NOTE — ED Provider Notes (Signed)
CSN: [id number]     Arrival date & time 08/22/14  [id number] History   First MD Initiated Contact with Patient 08/22/14 [phone]     Chief Complaint  Patient presents with  . wound reassess      (Consider location/radiation/quality/duration/timing/severity/associated sxs/prior Treatment) HPI Comments: This is a 28 year old female presents the emergency department for a wound check. She had an abscess drained on September 27, had packing removed 2 days later, however was advised to come back for recheck as it was not completely healed. Patient reports the area seems to be much better. Denies any problems. Denies drainage, fever or chills.  The history is provided by the patient.    Past Medical History  Diagnosis Date  . Enlarged heart   . Shortness of breath     on exertion   Past Surgical History  Procedure Laterality Date  . No past surgeries    . Cholecystectomy  02/20/2012    Procedure: LAPAROSCOPIC CHOLECYSTECTOMY WITH INTRAOPERATIVE CHOLANGIOGRAM;  Surgeon: Kandis Cockingavid H Newman, MD;  [facility];  Service: General;  Laterality: N/A;  . Ercp  02/21/2012    Procedure: ENDOSCOPIC RETROGRADE CHOLANGIOPANCREATOGRAPHY (ERCP);  Surgeon: Theda BelfastPatrick D Hung, MD;  [facility];  Service: Endoscopy;  Laterality: N/A;   Family History  Problem Relation Age of Onset  . Anesthesia problems Neg Hx   . Hypertension Father   . Diabetes Maternal Grandmother   . Hypertension Maternal Grandmother    History  Substance Use Topics  . Smoking status: Never Smoker   . Smokeless tobacco: Not on file  . Alcohol Use: 0.6 oz/week    1 Glasses of wine per week     Comment: monthly   OB History   Grav Para Term Preterm Abortions TAB SAB Ect Mult Living   3 2 2  0 1 0 0 1 0 2     Review of Systems  Constitutional: Negative.   HENT: Negative.   Respiratory: Negative.   Musculoskeletal: Negative.   Skin: Negative for color change.      Allergies  Amoxicillin  Home Medications   Prior to  Admission medications   Medication Sig Start Date End Date Taking? Authorizing Provider  HYDROcodone-acetaminophen (NORCO/VICODIN) 5-325 MG per tablet Take 1 tablet by mouth every 6 (six) hours as needed. 08/19/14   Hannah Muthersbaugh, PA-C  sulfamethoxazole-trimethoprim (SEPTRA DS) 800-160 MG per tablet Take 1 tablet by mouth every 12 (twelve) hours. 08/19/14   Hannah Muthersbaugh, PA-C   BP 114/75  Pulse 76  Temp(Src) 98.4 F (36.9 C) (Oral)  Resp 18  SpO2 100% Physical Exam  Nursing note and vitals reviewed. Constitutional: She is oriented to person, place, and time. She appears well-developed and well-nourished. No distress.  HENT:  Head: Normocephalic and atraumatic.  Mouth/Throat: Oropharynx is clear and moist.  Eyes: Conjunctivae and EOM are normal.  Neck: Normal range of motion. Neck supple.  Cardiovascular: Normal rate, regular rhythm and normal heart sounds.   Pulmonary/Chest: Effort normal and breath sounds normal. No respiratory distress.  Musculoskeletal: Normal range of motion. She exhibits no edema.  Neurological: She is alert and oriented to person, place, and time. No sensory deficit.  Skin: Skin is warm and dry.  Well-healed 1 cm incision. No drainage. No surrounding erythema, fluctuance or induration.  Psychiatric: She has a normal mood and affect. Her behavior is normal.    ED Course  Procedures (including critical care time) Labs Review Labs Reviewed - No data to display  Imaging Review  No results found.   EKG Interpretation None      MDM   Final diagnoses:  Wound check, abscess   Wound healed well. No signs of infection. Stable for discharge. Return precautions given. Patient states understanding of treatment care plan and is agreeable.   Trevor Mace, PA-C 08/22/14 [phone]

## 2014-08-22 NOTE — ED Notes (Signed)
Patient here for recheck of abscess healing under R arm.

## 2014-09-22 ENCOUNTER — Encounter (HOSPITAL_COMMUNITY): Payer: Self-pay | Admitting: Emergency Medicine
